# Patient Record
Sex: Female | Born: 1940 | Race: White | Hispanic: No | Smoking: Never smoker
Health system: Southern US, Community
[De-identification: ages and names within clinical notes are randomized; demographics above are authoritative.]

## PROBLEM LIST (undated history)

## (undated) DIAGNOSIS — I10 Essential (primary) hypertension: Secondary | ICD-10-CM

## (undated) DIAGNOSIS — Z9289 Personal history of other medical treatment: Secondary | ICD-10-CM

## (undated) DIAGNOSIS — D649 Anemia, unspecified: Secondary | ICD-10-CM

## (undated) HISTORY — PX: TONSILLECTOMY: SUR1361

## (undated) HISTORY — PX: DILATION AND CURETTAGE OF UTERUS: SHX78

## (undated) HISTORY — PX: TUBAL LIGATION: SHX77

---

## 1979-07-05 HISTORY — PX: CHOLECYSTECTOMY: SHX55

## 2007-02-17 ENCOUNTER — Emergency Department (HOSPITAL_COMMUNITY): Admission: EM | Admit: 2007-02-17 | Discharge: 2007-02-17 | Payer: Self-pay | Admitting: Emergency Medicine

## 2011-09-08 DIAGNOSIS — J019 Acute sinusitis, unspecified: Secondary | ICD-10-CM | POA: Diagnosis not present

## 2011-09-08 DIAGNOSIS — I10 Essential (primary) hypertension: Secondary | ICD-10-CM | POA: Diagnosis not present

## 2011-09-08 DIAGNOSIS — E782 Mixed hyperlipidemia: Secondary | ICD-10-CM | POA: Diagnosis not present

## 2011-09-08 DIAGNOSIS — M76899 Other specified enthesopathies of unspecified lower limb, excluding foot: Secondary | ICD-10-CM | POA: Diagnosis not present

## 2011-09-19 DIAGNOSIS — Z1231 Encounter for screening mammogram for malignant neoplasm of breast: Secondary | ICD-10-CM | POA: Diagnosis not present

## 2011-12-05 DIAGNOSIS — H2510 Age-related nuclear cataract, unspecified eye: Secondary | ICD-10-CM | POA: Diagnosis not present

## 2011-12-09 DIAGNOSIS — I10 Essential (primary) hypertension: Secondary | ICD-10-CM | POA: Diagnosis not present

## 2012-01-16 ENCOUNTER — Encounter (HOSPITAL_COMMUNITY): Payer: Self-pay | Admitting: Pharmacy Technician

## 2012-01-16 DIAGNOSIS — H2510 Age-related nuclear cataract, unspecified eye: Secondary | ICD-10-CM | POA: Diagnosis not present

## 2012-01-16 DIAGNOSIS — H547 Unspecified visual loss: Secondary | ICD-10-CM | POA: Diagnosis not present

## 2012-01-19 ENCOUNTER — Encounter (HOSPITAL_COMMUNITY): Payer: Self-pay

## 2012-01-19 ENCOUNTER — Encounter (HOSPITAL_COMMUNITY)
Admission: RE | Admit: 2012-01-19 | Discharge: 2012-01-19 | Disposition: A | Discharge status: Home / Self Care | Payer: Medicare Other | Source: Ambulatory Visit | Attending: Ophthalmology | Admitting: Ophthalmology

## 2012-01-19 DIAGNOSIS — I10 Essential (primary) hypertension: Secondary | ICD-10-CM | POA: Diagnosis not present

## 2012-01-19 DIAGNOSIS — H2510 Age-related nuclear cataract, unspecified eye: Secondary | ICD-10-CM | POA: Diagnosis not present

## 2012-01-19 DIAGNOSIS — Z79899 Other long term (current) drug therapy: Secondary | ICD-10-CM | POA: Diagnosis not present

## 2012-01-19 DIAGNOSIS — Z01812 Encounter for preprocedural laboratory examination: Secondary | ICD-10-CM | POA: Diagnosis not present

## 2012-01-19 DIAGNOSIS — Z01810 Encounter for preprocedural cardiovascular examination: Secondary | ICD-10-CM | POA: Diagnosis not present

## 2012-01-19 HISTORY — DX: Essential (primary) hypertension: I10

## 2012-01-19 HISTORY — DX: Anemia, unspecified: D64.9

## 2012-01-19 HISTORY — DX: Personal history of other medical treatment: Z92.89

## 2012-01-19 LAB — BASIC METABOLIC PANEL
BUN: 12 mg/dL (ref 6–23)
CO2: 30 mEq/L (ref 19–32)
Chloride: 104 mEq/L (ref 96–112)
Glucose, Bld: 100 mg/dL — ABNORMAL HIGH (ref 70–99)
Potassium: 3.7 mEq/L (ref 3.5–5.1)
Sodium: 142 mEq/L (ref 135–145)

## 2012-01-19 LAB — HEMOGLOBIN AND HEMATOCRIT, BLOOD: HCT: 40.1 % (ref 36.0–46.0)

## 2012-01-19 NOTE — Patient Instructions (Addendum)
Your procedure is scheduled on:  Monday, 01/23/12  Report to [facility] at  1230 PM.  Call this number if you have problems the morning of surgery: [phone]   Remember:   Do not eat or drink   After Midnight.  Take these medicines the morning of surgery with A SIP OF WATER: Cardura and Zestoretic   Do not wear jewelry, make-up or nail polish.  Do not wear lotions, powders, or perfumes. You may wear deodorant.  Do not bring valuables to the hospital.  Contacts, dentures or bridgework may not be worn into surgery.     Patients discharged the day of surgery will not be allowed to drive home.  Name and phone number of your driver: driver  Special Instructions: Use eye drops as directed.   Please read over the following fact sheets that you were given: Pain Booklet, Anesthesia Post-op Instructions and Care and Recovery After Surgery    Cataract Surgery  A cataract is a clouding of the lens of the eye. When a lens becomes cloudy, vision is reduced based on the degree and nature of the clouding. Surgery may be needed to improve vision. Surgery removes the cloudy lens and usually replaces it with a substitute lens (intraocular lens, IOL). LET YOUR EYE DOCTOR KNOW ABOUT:  Allergies to food or medicine.   Medicines taken including herbs, eyedrops, over-the-counter medicines, and creams.   Use of steroids (by mouth or creams).   Previous problems with anesthetics or numbing medicine.   History of bleeding problems or blood clots.   Previous surgery.   Other health problems, including diabetes and kidney problems.   Possibility of pregnancy, if this applies.  RISKS AND COMPLICATIONS  Infection.   Inflammation of the eyeball (endophthalmitis) that can spread to both eyes (sympathetic ophthalmia).   Poor wound healing.   If an IOL is inserted, it can later fall out of proper position. This is very uncommon.   Clouding of the part of your eye that holds an IOL in place. This is  called an "after-cataract." These are uncommon, but easily treated.  BEFORE THE PROCEDURE  Do not eat or drink anything except small amounts of water for 8 to 12 before your surgery, or as directed by your caregiver.   Unless you are told otherwise, continue any eyedrops you have been prescribed.   Talk to your primary caregiver about all other medicines that you take (both prescription and non-prescription). In some cases, you may need to stop or change medicines near the time of your surgery. This is most important if you are taking blood-thinning medicine.Do not stop medicines unless you are told to do so.   Arrange for someone to drive you to and from the procedure.   Do not put contact lenses in either eye on the day of your surgery.  PROCEDURE There is more than one method for safely removing a cataract. Your doctor can explain the differences and help determine which is best for you. Phacoemulsification surgery is the most common form of cataract surgery.  An injection is given behind the eye or eyedrops are given to make this a painless procedure.   A small cut (incision) is made on the edge of the clear, dome-shaped surface that covers the front of the eye (cornea).   A tiny probe is painlessly inserted into the eye. This device gives off ultrasound waves that soften and break up the cloudy center of the lens. This makes it easier for the  cloudy lens to be removed by suction.   An IOL may be implanted.   The normal lens of the eye is covered by a clear capsule. Part of that capsule is intentionally left in the eye to support the IOL.   Your surgeon may or may not use stitches to close the incision.  There are other forms of cataract surgery that require a larger incision and stiches to close the eye. This approach is taken in cases where the doctor feels that the cataract cannot be easily removed using phacoemulsification. AFTER THE PROCEDURE  When an IOL is implanted, it  does not need care. It becomes a permanent part of your eye and cannot be seen or felt.   Your doctor will schedule follow-up exams to check on your progress.   Review your other medicines with your doctor to see which can be resumed after surgery.   Use eyedrops or take medicine as prescribed by your doctor.  Document Released: 06/09/2011 Document Reviewed: 06/06/2011 [facility] Patient Information 2012 [place], [place].  PATIENT INSTRUCTIONS POST-ANESTHESIA  IMMEDIATELY FOLLOWING SURGERY:  Do not drive or operate machinery for the first twenty four hours after surgery.  Do not make any important decisions for twenty four hours after surgery or while taking narcotic pain medications or sedatives.  If you develop intractable nausea and vomiting or a severe headache please notify your doctor immediately.  FOLLOW-UP:  Please make an appointment with your surgeon as instructed. You do not need to follow up with anesthesia unless specifically instructed to do so.  WOUND CARE INSTRUCTIONS (if applicable):  Keep a dry clean dressing on the anesthesia/puncture wound site if there is drainage.  Once the wound has quit draining you may leave it open to air.  Generally you should leave the bandage intact for twenty four hours unless there is drainage.  If the epidural site drains for more than 36-48 hours please call the anesthesia department.  QUESTIONS?:  Please feel free to call your physician or the hospital operator if you have any questions, and they will be happy to assist you.

## 2012-01-20 MED ORDER — PHENYLEPHRINE HCL 2.5 % OP SOLN
OPHTHALMIC | Status: AC
  Filled 2012-01-20: qty 2

## 2012-01-20 MED ORDER — TETRACAINE HCL 0.5 % OP SOLN
OPHTHALMIC | Status: AC
  Filled 2012-01-20: qty 2

## 2012-01-20 MED ORDER — CYCLOPENTOLATE-PHENYLEPHRINE 0.2-1 % OP SOLN
OPHTHALMIC | Status: AC
  Filled 2012-01-20: qty 2

## 2012-01-20 MED ORDER — NEOMYCIN-POLYMYXIN-DEXAMETH 3.5-10000-0.1 OP OINT
TOPICAL_OINTMENT | OPHTHALMIC | Status: AC
  Filled 2012-01-20: qty 3.5

## 2012-01-20 MED ORDER — LIDOCAINE HCL (PF) 1 % IJ SOLN
INTRAMUSCULAR | Status: AC
  Filled 2012-01-20: qty 2

## 2012-01-20 MED ORDER — LIDOCAINE HCL 3.5 % OP GEL
OPHTHALMIC | Status: AC
  Filled 2012-01-20: qty 5

## 2012-01-23 ENCOUNTER — Encounter (HOSPITAL_COMMUNITY)
Admission: RE | Disposition: A | Discharge status: Home / Self Care | Payer: Self-pay | Source: Ambulatory Visit | Attending: Ophthalmology

## 2012-01-23 ENCOUNTER — Encounter (HOSPITAL_COMMUNITY): Payer: Self-pay | Admitting: *Deleted

## 2012-01-23 ENCOUNTER — Ambulatory Visit (HOSPITAL_COMMUNITY)
Admission: RE | Admit: 2012-01-23 | Discharge: 2012-01-23 | Disposition: A | Discharge status: Home / Self Care | Payer: Medicare Other | Source: Ambulatory Visit | Attending: Ophthalmology | Admitting: Ophthalmology

## 2012-01-23 ENCOUNTER — Encounter (HOSPITAL_COMMUNITY): Payer: Self-pay | Admitting: Anesthesiology

## 2012-01-23 ENCOUNTER — Ambulatory Visit (HOSPITAL_COMMUNITY): Payer: Medicare Other | Admitting: Anesthesiology

## 2012-01-23 DIAGNOSIS — I10 Essential (primary) hypertension: Secondary | ICD-10-CM | POA: Diagnosis not present

## 2012-01-23 DIAGNOSIS — Z01810 Encounter for preprocedural cardiovascular examination: Secondary | ICD-10-CM | POA: Diagnosis not present

## 2012-01-23 DIAGNOSIS — Z01812 Encounter for preprocedural laboratory examination: Secondary | ICD-10-CM | POA: Insufficient documentation

## 2012-01-23 DIAGNOSIS — H269 Unspecified cataract: Secondary | ICD-10-CM | POA: Diagnosis not present

## 2012-01-23 DIAGNOSIS — Z79899 Other long term (current) drug therapy: Secondary | ICD-10-CM | POA: Insufficient documentation

## 2012-01-23 DIAGNOSIS — H2510 Age-related nuclear cataract, unspecified eye: Secondary | ICD-10-CM | POA: Diagnosis not present

## 2012-01-23 HISTORY — PX: CATARACT EXTRACTION W/PHACO: SHX586

## 2012-01-23 SURGERY — PHACOEMULSIFICATION, CATARACT, WITH IOL INSERTION
Anesthesia: Monitor Anesthesia Care | Site: Eye | Laterality: Right | Wound class: Clean

## 2012-01-23 MED ORDER — TETRACAINE HCL 0.5 % OP SOLN
1.0000 [drp] | OPHTHALMIC | Status: AC
  Administered 2012-01-23 (×3): 1 [drp] via OPHTHALMIC

## 2012-01-23 MED ORDER — BSS IO SOLN
INTRAOCULAR | Status: DC | PRN
  Administered 2012-01-23: 15 mL via INTRAOCULAR

## 2012-01-23 MED ORDER — POVIDONE-IODINE 5 % OP SOLN
OPHTHALMIC | Status: DC | PRN
  Administered 2012-01-23: 1 via OPHTHALMIC

## 2012-01-23 MED ORDER — EPINEPHRINE HCL 1 MG/ML IJ SOLN
INTRAOCULAR | Status: DC | PRN
  Administered 2012-01-23: 13:00:00

## 2012-01-23 MED ORDER — PHENYLEPHRINE HCL 2.5 % OP SOLN
1.0000 [drp] | OPHTHALMIC | Status: AC
  Administered 2012-01-23 (×3): 1 [drp] via OPHTHALMIC

## 2012-01-23 MED ORDER — LIDOCAINE HCL 3.5 % OP GEL
1.0000 "application " | Freq: Once | OPHTHALMIC | Status: AC
  Administered 2012-01-23: 1 via OPHTHALMIC

## 2012-01-23 MED ORDER — NEOMYCIN-POLYMYXIN-DEXAMETH 0.1 % OP OINT
TOPICAL_OINTMENT | OPHTHALMIC | Status: DC | PRN
  Administered 2012-01-23: 1 via OPHTHALMIC

## 2012-01-23 MED ORDER — KETOROLAC TROMETHAMINE 0.5 % OP SOLN: 1.0000 [drp] | OPHTHALMIC | Status: DC

## 2012-01-23 MED ORDER — MIDAZOLAM HCL 2 MG/2ML IJ SOLN
INTRAMUSCULAR | Status: AC
  Filled 2012-01-23: qty 2

## 2012-01-23 MED ORDER — LIDOCAINE 3.5 % OP GEL OPTIME - NO CHARGE
OPHTHALMIC | Status: DC | PRN
  Administered 2012-01-23: 2 [drp] via OPHTHALMIC

## 2012-01-23 MED ORDER — LIDOCAINE HCL (PF) 1 % IJ SOLN
INTRAMUSCULAR | Status: DC | PRN
  Administered 2012-01-23: .4 mL

## 2012-01-23 MED ORDER — MIDAZOLAM HCL 2 MG/2ML IJ SOLN
1.0000 mg | INTRAMUSCULAR | Status: DC | PRN
  Administered 2012-01-23: 2 mg via INTRAVENOUS

## 2012-01-23 MED ORDER — LACTATED RINGERS IV SOLN
INTRAVENOUS | Status: DC
  Administered 2012-01-23: 1000 mL via INTRAVENOUS

## 2012-01-23 MED ORDER — CYCLOPENTOLATE-PHENYLEPHRINE 0.2-1 % OP SOLN
1.0000 [drp] | OPHTHALMIC | Status: AC
  Administered 2012-01-23 (×3): 1 [drp] via OPHTHALMIC

## 2012-01-23 MED ORDER — PROVISC 10 MG/ML IO SOLN
INTRAOCULAR | Status: DC | PRN
  Administered 2012-01-23: 8.5 mg via INTRAOCULAR

## 2012-01-23 SURGICAL SUPPLY — 32 items

## 2012-01-23 NOTE — Transfer of Care (Signed)
Immediate Anesthesia Transfer of Care Note  Patient: Janice [name]  Procedure(s) Performed: Procedure(s) (LRB): CATARACT EXTRACTION PHACO AND INTRAOCULAR LENS PLACEMENT (IOC) (Right)  Patient Location: Shortstay  Anesthesia Type: MAC  Level of Consciousness: awake  Airway & Oxygen Therapy: Patient Spontanous Breathing   Post-op Assessment: Report given to PACU RN, Post -op Vital signs reviewed and stable and Patient moving all extremities  Post vital signs: Reviewed and stable  Complications: No apparent anesthesia complications

## 2012-01-23 NOTE — H&P (Signed)
I have reviewed the H&P, the patient was re-examined, and I have identified no interval changes in medical condition and plan of care since the history and physical of record  

## 2012-01-23 NOTE — Brief Op Note (Signed)
Pre-Op Dx: Cataract OD Post-Op Dx: Cataract OD Surgeon: Jamyah Folk Anesthesia: Topical with MAC Surgery: Cataract Extraction with Intraocular lens Implant OD Implant: Lenstec, Model Softec HD Blood Loss: None Specimen: None Complications: None 

## 2012-01-23 NOTE — Anesthesia Procedure Notes (Signed)
Procedure Name: MAC Date/Time: 01/23/2012 1:18 PM Performed by: Franco Nones Pre-anesthesia Checklist: Patient identified, Emergency Drugs available, Suction available, Timeout performed and Patient being monitored Patient Re-evaluated:Patient Re-evaluated prior to inductionOxygen Delivery Method: Nasal Cannula

## 2012-01-23 NOTE — Anesthesia Preprocedure Evaluation (Addendum)
Anesthesia Evaluation  Patient identified by MRN, date of birth, ID band Patient awake    Reviewed: Allergy & Precautions, H&P , NPO status , Patient's Chart, lab work & pertinent test results  History of Anesthesia Complications Negative for: history of anesthetic complications  Airway Mallampati: II      Dental  (+) Teeth Intact   Pulmonary neg pulmonary ROS,  breath sounds clear to auscultation        Cardiovascular hypertension, Pt. on medications Rhythm:Regular     Neuro/Psych    GI/Hepatic negative GI ROS,   Endo/Other    Renal/GU      Musculoskeletal   Abdominal   Peds  Hematology   Anesthesia Other Findings   Reproductive/Obstetrics                          Anesthesia Physical Anesthesia Plan  ASA: II  Anesthesia Plan: MAC   Post-op Pain Management:    Induction: Intravenous  Airway Management Planned: Nasal Cannula  Additional Equipment:   Intra-op Plan:   Post-operative Plan:   Informed Consent: I have reviewed the patients History and Physical, chart, labs and discussed the procedure including the risks, benefits and alternatives for the proposed anesthesia with the patient or authorized representative who has indicated his/her understanding and acceptance.     Plan Discussed with:   Anesthesia Plan Comments:         Anesthesia Quick Evaluation

## 2012-01-23 NOTE — Anesthesia Postprocedure Evaluation (Signed)
  Anesthesia Post-op Note  Patient: Janice [name]  Procedure(s) Performed: Procedure(s) (LRB): CATARACT EXTRACTION PHACO AND INTRAOCULAR LENS PLACEMENT (IOC) (Right)  Patient Location:  Short Stay  Anesthesia Type: MAC  Level of Consciousness: awake  Airway and Oxygen Therapy: Patient Spontanous Breathing  Post-op Pain: none  Post-op Assessment: Post-op Vital signs reviewed, Patient's Cardiovascular Status Stable, Respiratory Function Stable, Patent Airway, No signs of Nausea or vomiting and Pain level controlled  Post-op Vital Signs: Reviewed and stable  Complications: No apparent anesthesia complications

## 2012-01-24 NOTE — Op Note (Signed)
NAME:  Janice [name], Janice [name] NO.:  [id number]  MEDICAL RECORD NO.:  [id number]  LOCATION:  [facility]                          FACILITY:  [facility]  PHYSICIAN:  Susanne Greenhouse, MD       DATE OF BIRTH:  1941/02/25  DATE OF PROCEDURE:  01/23/2012 DATE OF DISCHARGE:  01/23/2012                              OPERATIVE REPORT   PREOPERATIVE DIAGNOSIS:  Nuclear cataract, right eye.  POSTOPERATIVE DIAGNOSIS:  Nuclear cataract, right eye,  DIAGNOSIS CODE:  366.16.  OPERATION PERFORMED:  Phacoemulsification with posterior chamber intraocular lens implantation, right eye.  SURGEON:  Susanne Greenhouse, MD  ANESTHESIA:  Topical with monitored anesthesia care and IV sedation.  OPERATIVE SUMMARY:  In the preoperative area, dilating drops were placed into the right eye.  The patient was then brought into the operating room where she was placed under general anesthesia.  The eye was then prepped and draped.  Beginning with a #75 blade, a paracentesis port was made at the surgeon's 2 o'clock position.  The anterior chamber was then filled with a 1% nonpreserved lidocaine solution with epinephrine.  This was followed by Viscoat to deepen the chamber.  A small fornix-based peritomy was performed superiorly.  Next, a single iris hook was placed through the limbus superiorly.  A 2.4-mm keratome blade was then used to make a clear corneal incision over the iris hook.  A bent cystotome needle and Utrata forceps were used to create a continuous tear capsulotomy.  Hydrodissection was performed using balanced salt solution on a fine cannula.  The lens nucleus was then removed using phacoemulsification in a quadrant cracking technique.  The cortical material was then removed with irrigation and aspiration.  The capsular bag and anterior chamber were refilled with Provisc.  The wound was widened to approximately 3 mm and a posterior chamber intraocular lens was placed into the capsular bag without difficulty  using an [employer] lens injecting system.  A single 10-0 nylon suture was then used to close the incision as well as stromal hydration.  The Provisc was removed from the anterior chamber and capsular bag with irrigation and aspiration.  At this point, the wounds were tested for leak, which were negative.  The anterior chamber remained deep and stable.  The patient tolerated the procedure well.  There were no operative complications, and she awoke from general anesthesia without problem.  SPECIMENS: No surgical specimens.  PROSTHETIC DEVICE USED: A Lenstec posterior chamber lens, model Softec HD, power of 23.25, serial number is [id number].          ______________________________ Susanne Greenhouse, MD     KEH/MEDQ  D:  01/23/2012  T:  01/24/2012  Job:  [id number]

## 2012-01-26 ENCOUNTER — Encounter (HOSPITAL_COMMUNITY): Payer: Self-pay | Admitting: Ophthalmology

## 2012-01-30 DIAGNOSIS — H2510 Age-related nuclear cataract, unspecified eye: Secondary | ICD-10-CM | POA: Diagnosis not present

## 2012-02-07 ENCOUNTER — Encounter (HOSPITAL_COMMUNITY)
Admission: RE | Admit: 2012-02-07 | Discharge: 2012-02-07 | Payer: Medicare Other | Source: Ambulatory Visit | Admitting: Ophthalmology

## 2012-02-07 ENCOUNTER — Encounter (HOSPITAL_COMMUNITY): Payer: Self-pay

## 2012-02-07 MED ORDER — FENTANYL CITRATE 0.05 MG/ML IJ SOLN: 25.0000 ug | INTRAMUSCULAR | Status: DC | PRN

## 2012-02-07 MED ORDER — ONDANSETRON HCL 4 MG/2ML IJ SOLN: 4.0000 mg | Freq: Once | INTRAMUSCULAR | Status: DC | PRN

## 2012-02-10 MED ORDER — LIDOCAINE HCL 3.5 % OP GEL
OPHTHALMIC | Status: AC
  Filled 2012-02-10: qty 5

## 2012-02-10 MED ORDER — NEOMYCIN-POLYMYXIN-DEXAMETH 3.5-10000-0.1 OP OINT
TOPICAL_OINTMENT | OPHTHALMIC | Status: AC
  Filled 2012-02-10: qty 3.5

## 2012-02-10 MED ORDER — TETRACAINE HCL 0.5 % OP SOLN
OPHTHALMIC | Status: AC
  Filled 2012-02-10: qty 2

## 2012-02-10 MED ORDER — PHENYLEPHRINE HCL 2.5 % OP SOLN
OPHTHALMIC | Status: AC
  Filled 2012-02-10: qty 2

## 2012-02-10 MED ORDER — CYCLOPENTOLATE HCL 1 % OP SOLN
OPHTHALMIC | Status: AC
  Filled 2012-02-10: qty 2

## 2012-02-10 MED ORDER — LIDOCAINE HCL (PF) 1 % IJ SOLN
INTRAMUSCULAR | Status: AC
  Filled 2012-02-10: qty 2

## 2012-02-13 ENCOUNTER — Ambulatory Visit (HOSPITAL_COMMUNITY)
Admission: RE | Admit: 2012-02-13 | Discharge: 2012-02-13 | Disposition: A | Discharge status: Home / Self Care | Payer: Medicare Other | Source: Ambulatory Visit | Attending: Ophthalmology | Admitting: Ophthalmology

## 2012-02-13 ENCOUNTER — Ambulatory Visit (HOSPITAL_COMMUNITY): Payer: Medicare Other | Admitting: Anesthesiology

## 2012-02-13 ENCOUNTER — Encounter (HOSPITAL_COMMUNITY)
Admission: RE | Disposition: A | Discharge status: Home / Self Care | Payer: Self-pay | Source: Ambulatory Visit | Attending: Ophthalmology

## 2012-02-13 ENCOUNTER — Encounter (HOSPITAL_COMMUNITY): Payer: Self-pay | Admitting: *Deleted

## 2012-02-13 ENCOUNTER — Encounter (HOSPITAL_COMMUNITY): Payer: Self-pay | Admitting: Anesthesiology

## 2012-02-13 DIAGNOSIS — Z79899 Other long term (current) drug therapy: Secondary | ICD-10-CM | POA: Diagnosis not present

## 2012-02-13 DIAGNOSIS — H2510 Age-related nuclear cataract, unspecified eye: Secondary | ICD-10-CM | POA: Diagnosis not present

## 2012-02-13 DIAGNOSIS — I10 Essential (primary) hypertension: Secondary | ICD-10-CM | POA: Diagnosis not present

## 2012-02-13 DIAGNOSIS — H269 Unspecified cataract: Secondary | ICD-10-CM | POA: Diagnosis not present

## 2012-02-13 HISTORY — PX: CATARACT EXTRACTION W/PHACO: SHX586

## 2012-02-13 SURGERY — PHACOEMULSIFICATION, CATARACT, WITH IOL INSERTION
Anesthesia: Monitor Anesthesia Care | Site: Eye | Laterality: Left | Wound class: Clean

## 2012-02-13 MED ORDER — PHENYLEPHRINE HCL 2.5 % OP SOLN
1.0000 [drp] | OPHTHALMIC | Status: AC
  Administered 2012-02-13 (×3): 1 [drp] via OPHTHALMIC

## 2012-02-13 MED ORDER — BSS IO SOLN
INTRAOCULAR | Status: DC | PRN
  Administered 2012-02-13: 15 mL via INTRAOCULAR

## 2012-02-13 MED ORDER — LACTATED RINGERS IV SOLN
INTRAVENOUS | Status: DC | PRN
  Administered 2012-02-13: 08:00:00 via INTRAVENOUS

## 2012-02-13 MED ORDER — LIDOCAINE HCL 3.5 % OP GEL
1.0000 "application " | Freq: Once | OPHTHALMIC | Status: AC
  Administered 2012-02-13: 1 via OPHTHALMIC

## 2012-02-13 MED ORDER — MIDAZOLAM HCL 2 MG/2ML IJ SOLN
INTRAMUSCULAR | Status: AC
  Filled 2012-02-13: qty 2

## 2012-02-13 MED ORDER — LIDOCAINE HCL (PF) 1 % IJ SOLN
INTRAMUSCULAR | Status: DC | PRN
  Administered 2012-02-13: .4 mL

## 2012-02-13 MED ORDER — CYCLOPENTOLATE HCL 1 % OP SOLN
1.0000 [drp] | OPHTHALMIC | Status: AC
  Administered 2012-02-13 (×3): 1 [drp] via OPHTHALMIC

## 2012-02-13 MED ORDER — LIDOCAINE 3.5 % OP GEL OPTIME - NO CHARGE
OPHTHALMIC | Status: DC | PRN
  Administered 2012-02-13: 1 [drp] via OPHTHALMIC

## 2012-02-13 MED ORDER — NEOMYCIN-POLYMYXIN-DEXAMETH 0.1 % OP OINT
TOPICAL_OINTMENT | OPHTHALMIC | Status: DC | PRN
  Administered 2012-02-13: 1 via OPHTHALMIC

## 2012-02-13 MED ORDER — EPINEPHRINE HCL 1 MG/ML IJ SOLN
INTRAOCULAR | Status: DC | PRN
  Administered 2012-02-13: 08:00:00

## 2012-02-13 MED ORDER — PROVISC 10 MG/ML IO SOLN
INTRAOCULAR | Status: DC | PRN
  Administered 2012-02-13: 8.5 mg via INTRAOCULAR

## 2012-02-13 MED ORDER — MIDAZOLAM HCL 2 MG/2ML IJ SOLN
1.0000 mg | INTRAMUSCULAR | Status: DC | PRN
  Administered 2012-02-13: 2 mg via INTRAVENOUS

## 2012-02-13 MED ORDER — LACTATED RINGERS IV SOLN
INTRAVENOUS | Status: DC
  Administered 2012-02-13: 1000 mL via INTRAVENOUS

## 2012-02-13 MED ORDER — POVIDONE-IODINE 5 % OP SOLN
OPHTHALMIC | Status: DC | PRN
  Administered 2012-02-13: 1 via OPHTHALMIC

## 2012-02-13 MED ORDER — TETRACAINE HCL 0.5 % OP SOLN
1.0000 [drp] | OPHTHALMIC | Status: AC
  Administered 2012-02-13 (×3): 1 [drp] via OPHTHALMIC

## 2012-02-13 SURGICAL SUPPLY — 32 items

## 2012-02-13 NOTE — Transfer of Care (Signed)
Immediate Anesthesia Transfer of Care Note  Patient: Janice [name]  Procedure(s) Performed: Procedure(s) (LRB): CATARACT EXTRACTION PHACO AND INTRAOCULAR LENS PLACEMENT (IOC) (Left)  Patient Location: Short Stay  Anesthesia Type: MAC  Level of Consciousness: awake and alert   Airway & Oxygen Therapy: Patient Spontanous Breathing  Post-op Assessment: Report given to PACU RN  Post vital signs: Reviewed and stable  Complications: No apparent anesthesia complications

## 2012-02-13 NOTE — Anesthesia Postprocedure Evaluation (Signed)
  Anesthesia Post-op Note  Patient: Janice [name]  Procedure(s) Performed: Procedure(s) (LRB): CATARACT EXTRACTION PHACO AND INTRAOCULAR LENS PLACEMENT (IOC) (Left)  Patient Location: Short Stay  Anesthesia Type: MAC  Level of Consciousness: awake, alert  and oriented  Airway and Oxygen Therapy: Patient Spontanous Breathing  Post-op Pain: none  Post-op Assessment: Post-op Vital signs reviewed, Patient's Cardiovascular Status Stable, Respiratory Function Stable, Patent Airway, No signs of Nausea or vomiting and Pain level controlled  Post-op Vital Signs: Reviewed and stable  Complications: No apparent anesthesia complications

## 2012-02-13 NOTE — H&P (Signed)
I have reviewed the H&P, the patient was re-examined, and I have identified no interval changes in medical condition and plan of care since the history and physical of record  

## 2012-02-13 NOTE — Brief Op Note (Signed)
Pre-Op Dx: Cataract OS Post-Op Dx: Cataract OS Surgeon: Lynelle Weiler Anesthesia: Topical with MAC Surgery: Cataract Extraction with Intraocular lens Implant OS Implant: Lenstec, Model Softec HD Specimen: None Complications: None 

## 2012-02-13 NOTE — Anesthesia Preprocedure Evaluation (Signed)
Anesthesia Evaluation  Patient identified by MRN, date of birth, ID band Patient awake    Reviewed: Allergy & Precautions, H&P , NPO status , Patient's Chart, lab work & pertinent test results  History of Anesthesia Complications Negative for: history of anesthetic complications  Airway Mallampati: II      Dental  (+) Teeth Intact   Pulmonary neg pulmonary ROS,  breath sounds clear to auscultation        Cardiovascular hypertension, Pt. on medications Rhythm:Regular     Neuro/Psych    GI/Hepatic negative GI ROS,   Endo/Other    Renal/GU      Musculoskeletal   Abdominal   Peds  Hematology   Anesthesia Other Findings   Reproductive/Obstetrics                          Anesthesia Physical Anesthesia Plan  ASA: II  Anesthesia Plan: MAC   Post-op Pain Management:    Induction: Intravenous  Airway Management Planned: Nasal Cannula  Additional Equipment:   Intra-op Plan:   Post-operative Plan:   Informed Consent: I have reviewed the patients History and Physical, chart, labs and discussed the procedure including the risks, benefits and alternatives for the proposed anesthesia with the patient or authorized representative who has indicated his/her understanding and acceptance.     Plan Discussed with:   Anesthesia Plan Comments:         Anesthesia Quick Evaluation  

## 2012-02-13 NOTE — Preoperative (Signed)
Beta Blockers   Reason not to administer Beta Blockers:Not Applicable 

## 2012-02-14 NOTE — Op Note (Signed)
NAME:  Janice [name], Janice [name] NO.:  [id number]  MEDICAL RECORD NO.:  [id number]  LOCATION:  [facility]                          FACILITY:  [facility]  PHYSICIAN:  Susanne Greenhouse, MD       DATE OF BIRTH:  25-Feb-1941  DATE OF PROCEDURE:  02/13/2012 DATE OF DISCHARGE:  02/13/2012                              OPERATIVE REPORT   PREOPERATIVE DIAGNOSIS:  Nuclear cataract, left eye, diagnosis code 366.16.  POSTOPERATIVE DIAGNOSIS:  Nuclear cataract, left eye, diagnosis code 366.16.  OPERATION PERFORMED:  Phacoemulsification with posterior chamber intraocular lens implantation, left eye.  SURGEON:  Susanne Greenhouse, MD  ANESTHESIA:  Topical with monitored anesthesia care and IV sedation.  OPERATIVE SUMMARY:  In the preoperative area, dilating drops were placed into the left eye.  The patient was then brought into the operating room where she was placed under general anesthesia.  The eye was then prepped and draped.  Beginning with a 75 blade, a paracentesis port was made at the surgeon's 2 o'clock position.  The anterior chamber was then filled with a 1% nonpreserved lidocaine solution with epinephrine.  This was followed by Viscoat to deepen the chamber.  A small fornix-based peritomy was performed superiorly.  Next, a single iris hook was placed through the limbus superiorly.  A 2.4-mm keratome blade was then used to make a clear corneal incision over the iris hook.  A bent cystotome needle and Utrata forceps were used to create a continuous tear capsulotomy.  Hydrodissection was performed using balanced salt solution on a fine cannula.  The lens nucleus was then removed using phacoemulsification in a quadrant cracking technique.  The cortical material was then removed with irrigation and aspiration.  The capsular bag and anterior chamber were refilled with Provisc.  The wound was widened to approximately 3 mm and a posterior chamber intraocular lens was placed into the capsular bag  without difficulty using an [employer] lens injecting system.  A single 10-0 nylon suture was then used to close the incision as well as stromal hydration.  The Provisc was removed from the anterior chamber and capsular bag with irrigation and aspiration.  At this point, the wounds were tested for leak, which were negative.  The anterior chamber remained deep and stable.  The patient tolerated the procedure well.  There were no operative complications, and she awoke from general anesthesia without problem.  There were no surgical specimens.  Prosthetic device used is a Lenstec posterior chamber lens, model Softec HD, power of 22.75, serial number is [id number].          ______________________________ Susanne Greenhouse, MD     KEH/MEDQ  D:  02/13/2012  T:  02/14/2012  Job:  [id number]

## 2012-02-16 ENCOUNTER — Encounter (HOSPITAL_COMMUNITY): Payer: Self-pay | Admitting: Ophthalmology

## 2012-03-09 DIAGNOSIS — I10 Essential (primary) hypertension: Secondary | ICD-10-CM | POA: Diagnosis not present

## 2012-06-11 DIAGNOSIS — I10 Essential (primary) hypertension: Secondary | ICD-10-CM | POA: Diagnosis not present

## 2012-09-10 DIAGNOSIS — I10 Essential (primary) hypertension: Secondary | ICD-10-CM | POA: Diagnosis not present

## 2012-09-10 DIAGNOSIS — E782 Mixed hyperlipidemia: Secondary | ICD-10-CM | POA: Diagnosis not present

## 2012-09-19 DIAGNOSIS — Z1231 Encounter for screening mammogram for malignant neoplasm of breast: Secondary | ICD-10-CM | POA: Diagnosis not present

## 2013-01-11 DIAGNOSIS — I10 Essential (primary) hypertension: Secondary | ICD-10-CM | POA: Diagnosis not present

## 2013-01-11 DIAGNOSIS — L570 Actinic keratosis: Secondary | ICD-10-CM | POA: Diagnosis not present

## 2013-04-15 DIAGNOSIS — I10 Essential (primary) hypertension: Secondary | ICD-10-CM | POA: Diagnosis not present

## 2013-05-03 DIAGNOSIS — Z23 Encounter for immunization: Secondary | ICD-10-CM | POA: Diagnosis not present

## 2013-07-16 DIAGNOSIS — I10 Essential (primary) hypertension: Secondary | ICD-10-CM | POA: Diagnosis not present

## 2013-07-16 DIAGNOSIS — Z0000 Encounter for general adult medical examination without abnormal findings: Secondary | ICD-10-CM | POA: Diagnosis not present

## 2013-07-17 DIAGNOSIS — I10 Essential (primary) hypertension: Secondary | ICD-10-CM | POA: Diagnosis not present

## 2013-07-17 DIAGNOSIS — Z131 Encounter for screening for diabetes mellitus: Secondary | ICD-10-CM | POA: Diagnosis not present

## 2013-09-26 DIAGNOSIS — Z1231 Encounter for screening mammogram for malignant neoplasm of breast: Secondary | ICD-10-CM | POA: Diagnosis not present

## 2013-10-14 DIAGNOSIS — I10 Essential (primary) hypertension: Secondary | ICD-10-CM | POA: Diagnosis not present

## 2014-01-13 DIAGNOSIS — IMO0002 Reserved for concepts with insufficient information to code with codable children: Secondary | ICD-10-CM | POA: Diagnosis not present

## 2014-01-13 DIAGNOSIS — I10 Essential (primary) hypertension: Secondary | ICD-10-CM | POA: Diagnosis not present

## 2014-04-15 DIAGNOSIS — I10 Essential (primary) hypertension: Secondary | ICD-10-CM | POA: Diagnosis not present

## 2014-06-03 DIAGNOSIS — J018 Other acute sinusitis: Secondary | ICD-10-CM | POA: Diagnosis not present

## 2014-06-04 DIAGNOSIS — I10 Essential (primary) hypertension: Secondary | ICD-10-CM | POA: Diagnosis not present

## 2014-10-21 DIAGNOSIS — Z1389 Encounter for screening for other disorder: Secondary | ICD-10-CM | POA: Diagnosis not present

## 2014-10-21 DIAGNOSIS — E785 Hyperlipidemia, unspecified: Secondary | ICD-10-CM | POA: Diagnosis not present

## 2014-10-21 DIAGNOSIS — Z0000 Encounter for general adult medical examination without abnormal findings: Secondary | ICD-10-CM | POA: Diagnosis not present

## 2014-10-21 DIAGNOSIS — I10 Essential (primary) hypertension: Secondary | ICD-10-CM | POA: Diagnosis not present

## 2014-10-23 DIAGNOSIS — E785 Hyperlipidemia, unspecified: Secondary | ICD-10-CM | POA: Diagnosis not present

## 2015-11-11 DIAGNOSIS — H1031 Unspecified acute conjunctivitis, right eye: Secondary | ICD-10-CM | POA: Diagnosis not present

## 2015-11-11 DIAGNOSIS — Z961 Presence of intraocular lens: Secondary | ICD-10-CM | POA: Diagnosis not present

## 2015-12-02 DIAGNOSIS — Z0000 Encounter for general adult medical examination without abnormal findings: Secondary | ICD-10-CM | POA: Diagnosis not present

## 2015-12-02 DIAGNOSIS — M545 Low back pain: Secondary | ICD-10-CM | POA: Diagnosis not present

## 2015-12-02 DIAGNOSIS — Z23 Encounter for immunization: Secondary | ICD-10-CM | POA: Diagnosis not present

## 2015-12-02 DIAGNOSIS — Z131 Encounter for screening for diabetes mellitus: Secondary | ICD-10-CM | POA: Diagnosis not present

## 2015-12-02 DIAGNOSIS — Z1389 Encounter for screening for other disorder: Secondary | ICD-10-CM | POA: Diagnosis not present

## 2015-12-02 DIAGNOSIS — I10 Essential (primary) hypertension: Secondary | ICD-10-CM | POA: Diagnosis not present

## 2015-12-02 DIAGNOSIS — R5383 Other fatigue: Secondary | ICD-10-CM | POA: Diagnosis not present

## 2015-12-16 DIAGNOSIS — H524 Presbyopia: Secondary | ICD-10-CM | POA: Diagnosis not present

## 2015-12-16 DIAGNOSIS — Z961 Presence of intraocular lens: Secondary | ICD-10-CM | POA: Diagnosis not present

## 2016-03-10 DIAGNOSIS — I10 Essential (primary) hypertension: Secondary | ICD-10-CM | POA: Diagnosis not present

## 2016-03-10 DIAGNOSIS — M545 Low back pain: Secondary | ICD-10-CM | POA: Diagnosis not present

## 2016-06-09 DIAGNOSIS — Z131 Encounter for screening for diabetes mellitus: Secondary | ICD-10-CM | POA: Diagnosis not present

## 2016-06-09 DIAGNOSIS — M545 Low back pain: Secondary | ICD-10-CM | POA: Diagnosis not present

## 2016-06-09 DIAGNOSIS — I10 Essential (primary) hypertension: Secondary | ICD-10-CM | POA: Diagnosis not present

## 2016-09-08 DIAGNOSIS — M545 Low back pain: Secondary | ICD-10-CM | POA: Diagnosis not present

## 2016-09-08 DIAGNOSIS — I10 Essential (primary) hypertension: Secondary | ICD-10-CM | POA: Diagnosis not present

## 2016-12-08 DIAGNOSIS — Z79899 Other long term (current) drug therapy: Secondary | ICD-10-CM | POA: Diagnosis not present

## 2016-12-08 DIAGNOSIS — M545 Low back pain: Secondary | ICD-10-CM | POA: Diagnosis not present

## 2016-12-08 DIAGNOSIS — I10 Essential (primary) hypertension: Secondary | ICD-10-CM | POA: Diagnosis not present

## 2017-08-14 DIAGNOSIS — H26493 Other secondary cataract, bilateral: Secondary | ICD-10-CM | POA: Diagnosis not present

## 2017-08-14 DIAGNOSIS — H524 Presbyopia: Secondary | ICD-10-CM | POA: Diagnosis not present

## 2017-08-14 DIAGNOSIS — Z961 Presence of intraocular lens: Secondary | ICD-10-CM | POA: Diagnosis not present

## 2017-09-18 DIAGNOSIS — G4709 Other insomnia: Secondary | ICD-10-CM | POA: Diagnosis not present

## 2017-09-18 DIAGNOSIS — E7849 Other hyperlipidemia: Secondary | ICD-10-CM | POA: Diagnosis not present

## 2017-09-18 DIAGNOSIS — I10 Essential (primary) hypertension: Secondary | ICD-10-CM | POA: Diagnosis not present

## 2017-09-18 DIAGNOSIS — M545 Low back pain: Secondary | ICD-10-CM | POA: Diagnosis not present

## 2017-10-16 DIAGNOSIS — H26491 Other secondary cataract, right eye: Secondary | ICD-10-CM | POA: Diagnosis not present

## 2017-10-16 DIAGNOSIS — H26493 Other secondary cataract, bilateral: Secondary | ICD-10-CM | POA: Diagnosis not present

## 2017-10-16 DIAGNOSIS — H26492 Other secondary cataract, left eye: Secondary | ICD-10-CM | POA: Diagnosis not present

## 2018-01-18 DIAGNOSIS — G4709 Other insomnia: Secondary | ICD-10-CM | POA: Diagnosis not present

## 2018-01-18 DIAGNOSIS — E7849 Other hyperlipidemia: Secondary | ICD-10-CM | POA: Diagnosis not present

## 2018-01-18 DIAGNOSIS — M545 Low back pain: Secondary | ICD-10-CM | POA: Diagnosis not present

## 2018-01-18 DIAGNOSIS — I10 Essential (primary) hypertension: Secondary | ICD-10-CM | POA: Diagnosis not present

## 2018-05-17 DIAGNOSIS — Z0000 Encounter for general adult medical examination without abnormal findings: Secondary | ICD-10-CM | POA: Diagnosis not present

## 2018-05-17 DIAGNOSIS — E7849 Other hyperlipidemia: Secondary | ICD-10-CM | POA: Diagnosis not present

## 2018-05-17 DIAGNOSIS — M545 Low back pain: Secondary | ICD-10-CM | POA: Diagnosis not present

## 2018-05-17 DIAGNOSIS — I10 Essential (primary) hypertension: Secondary | ICD-10-CM | POA: Diagnosis not present

## 2018-05-17 DIAGNOSIS — G4709 Other insomnia: Secondary | ICD-10-CM | POA: Diagnosis not present

## 2018-05-17 DIAGNOSIS — Z1389 Encounter for screening for other disorder: Secondary | ICD-10-CM | POA: Diagnosis not present

## 2018-05-17 DIAGNOSIS — N181 Chronic kidney disease, stage 1: Secondary | ICD-10-CM | POA: Diagnosis not present

## 2018-06-25 DIAGNOSIS — M85852 Other specified disorders of bone density and structure, left thigh: Secondary | ICD-10-CM | POA: Diagnosis not present

## 2018-06-25 DIAGNOSIS — M810 Age-related osteoporosis without current pathological fracture: Secondary | ICD-10-CM | POA: Diagnosis not present

## 2018-06-25 DIAGNOSIS — Z780 Asymptomatic menopausal state: Secondary | ICD-10-CM | POA: Diagnosis not present

## 2018-09-03 DIAGNOSIS — N181 Chronic kidney disease, stage 1: Secondary | ICD-10-CM | POA: Diagnosis not present

## 2018-09-03 DIAGNOSIS — I10 Essential (primary) hypertension: Secondary | ICD-10-CM | POA: Diagnosis not present

## 2018-09-03 DIAGNOSIS — M545 Low back pain: Secondary | ICD-10-CM | POA: Diagnosis not present

## 2018-09-03 DIAGNOSIS — E7849 Other hyperlipidemia: Secondary | ICD-10-CM | POA: Diagnosis not present

## 2019-01-03 DIAGNOSIS — M545 Low back pain: Secondary | ICD-10-CM | POA: Diagnosis not present

## 2019-01-03 DIAGNOSIS — I10 Essential (primary) hypertension: Secondary | ICD-10-CM | POA: Diagnosis not present

## 2019-01-03 DIAGNOSIS — N181 Chronic kidney disease, stage 1: Secondary | ICD-10-CM | POA: Diagnosis not present

## 2019-01-03 DIAGNOSIS — E7849 Other hyperlipidemia: Secondary | ICD-10-CM | POA: Diagnosis not present

## 2019-02-01 ENCOUNTER — Other Ambulatory Visit: Payer: Self-pay

## 2019-05-06 DIAGNOSIS — N181 Chronic kidney disease, stage 1: Secondary | ICD-10-CM | POA: Diagnosis not present

## 2019-05-06 DIAGNOSIS — E7849 Other hyperlipidemia: Secondary | ICD-10-CM | POA: Diagnosis not present

## 2019-05-06 DIAGNOSIS — M545 Low back pain: Secondary | ICD-10-CM | POA: Diagnosis not present

## 2019-05-06 DIAGNOSIS — I10 Essential (primary) hypertension: Secondary | ICD-10-CM | POA: Diagnosis not present

## 2019-09-03 DIAGNOSIS — I10 Essential (primary) hypertension: Secondary | ICD-10-CM | POA: Diagnosis not present

## 2019-09-03 DIAGNOSIS — Z1389 Encounter for screening for other disorder: Secondary | ICD-10-CM | POA: Diagnosis not present

## 2019-09-03 DIAGNOSIS — E7849 Other hyperlipidemia: Secondary | ICD-10-CM | POA: Diagnosis not present

## 2019-09-03 DIAGNOSIS — N181 Chronic kidney disease, stage 1: Secondary | ICD-10-CM | POA: Diagnosis not present

## 2019-09-03 DIAGNOSIS — M545 Low back pain: Secondary | ICD-10-CM | POA: Diagnosis not present

## 2019-09-03 DIAGNOSIS — Z23 Encounter for immunization: Secondary | ICD-10-CM | POA: Diagnosis not present

## 2019-09-03 DIAGNOSIS — Z0000 Encounter for general adult medical examination without abnormal findings: Secondary | ICD-10-CM | POA: Diagnosis not present

## 2019-10-01 DIAGNOSIS — Z23 Encounter for immunization: Secondary | ICD-10-CM | POA: Diagnosis not present

## 2020-03-26 DIAGNOSIS — Z961 Presence of intraocular lens: Secondary | ICD-10-CM | POA: Diagnosis not present

## 2020-03-26 DIAGNOSIS — H524 Presbyopia: Secondary | ICD-10-CM | POA: Diagnosis not present

## 2020-04-17 DIAGNOSIS — Z23 Encounter for immunization: Secondary | ICD-10-CM | POA: Diagnosis not present

## 2020-05-11 DIAGNOSIS — I10 Essential (primary) hypertension: Secondary | ICD-10-CM | POA: Diagnosis not present

## 2020-05-11 DIAGNOSIS — E7849 Other hyperlipidemia: Secondary | ICD-10-CM | POA: Diagnosis not present

## 2020-05-11 DIAGNOSIS — M5459 Other low back pain: Secondary | ICD-10-CM | POA: Diagnosis not present

## 2020-05-11 DIAGNOSIS — N181 Chronic kidney disease, stage 1: Secondary | ICD-10-CM | POA: Diagnosis not present

## 2020-06-01 DIAGNOSIS — S134XXA Sprain of ligaments of cervical spine, initial encounter: Secondary | ICD-10-CM | POA: Diagnosis not present

## 2020-09-08 DIAGNOSIS — S134XXA Sprain of ligaments of cervical spine, initial encounter: Secondary | ICD-10-CM | POA: Diagnosis not present

## 2020-09-08 DIAGNOSIS — I10 Essential (primary) hypertension: Secondary | ICD-10-CM | POA: Diagnosis not present

## 2020-09-08 DIAGNOSIS — Z0000 Encounter for general adult medical examination without abnormal findings: Secondary | ICD-10-CM | POA: Diagnosis not present

## 2020-09-08 DIAGNOSIS — E7849 Other hyperlipidemia: Secondary | ICD-10-CM | POA: Diagnosis not present

## 2020-09-08 DIAGNOSIS — M5459 Other low back pain: Secondary | ICD-10-CM | POA: Diagnosis not present

## 2020-09-08 DIAGNOSIS — N181 Chronic kidney disease, stage 1: Secondary | ICD-10-CM | POA: Diagnosis not present

## 2020-09-21 DIAGNOSIS — M85852 Other specified disorders of bone density and structure, left thigh: Secondary | ICD-10-CM | POA: Diagnosis not present

## 2020-09-21 DIAGNOSIS — M810 Age-related osteoporosis without current pathological fracture: Secondary | ICD-10-CM | POA: Diagnosis not present

## 2020-09-21 DIAGNOSIS — Z780 Asymptomatic menopausal state: Secondary | ICD-10-CM | POA: Diagnosis not present

## 2021-01-12 DIAGNOSIS — E7849 Other hyperlipidemia: Secondary | ICD-10-CM | POA: Diagnosis not present

## 2021-01-12 DIAGNOSIS — M5459 Other low back pain: Secondary | ICD-10-CM | POA: Diagnosis not present

## 2021-01-12 DIAGNOSIS — I10 Essential (primary) hypertension: Secondary | ICD-10-CM | POA: Diagnosis not present

## 2021-01-12 DIAGNOSIS — N181 Chronic kidney disease, stage 1: Secondary | ICD-10-CM | POA: Diagnosis not present

## 2021-03-03 DIAGNOSIS — M5459 Other low back pain: Secondary | ICD-10-CM | POA: Diagnosis not present

## 2021-03-03 DIAGNOSIS — E7849 Other hyperlipidemia: Secondary | ICD-10-CM | POA: Diagnosis not present

## 2021-03-03 DIAGNOSIS — N181 Chronic kidney disease, stage 1: Secondary | ICD-10-CM | POA: Diagnosis not present

## 2021-03-03 DIAGNOSIS — I10 Essential (primary) hypertension: Secondary | ICD-10-CM | POA: Diagnosis not present

## 2021-03-05 DIAGNOSIS — U071 COVID-19: Secondary | ICD-10-CM | POA: Diagnosis not present

## 2021-04-02 DIAGNOSIS — E7849 Other hyperlipidemia: Secondary | ICD-10-CM | POA: Diagnosis not present

## 2021-04-02 DIAGNOSIS — I10 Essential (primary) hypertension: Secondary | ICD-10-CM | POA: Diagnosis not present

## 2021-04-05 DIAGNOSIS — I10 Essential (primary) hypertension: Secondary | ICD-10-CM | POA: Diagnosis not present

## 2021-04-19 DIAGNOSIS — I10 Essential (primary) hypertension: Secondary | ICD-10-CM | POA: Diagnosis not present

## 2021-05-03 DIAGNOSIS — E7849 Other hyperlipidemia: Secondary | ICD-10-CM | POA: Diagnosis not present

## 2021-05-03 DIAGNOSIS — I10 Essential (primary) hypertension: Secondary | ICD-10-CM | POA: Diagnosis not present

## 2021-06-02 DIAGNOSIS — I10 Essential (primary) hypertension: Secondary | ICD-10-CM | POA: Diagnosis not present

## 2021-06-02 DIAGNOSIS — E7849 Other hyperlipidemia: Secondary | ICD-10-CM | POA: Diagnosis not present

## 2021-07-02 DIAGNOSIS — I10 Essential (primary) hypertension: Secondary | ICD-10-CM | POA: Diagnosis not present

## 2021-07-02 DIAGNOSIS — E7849 Other hyperlipidemia: Secondary | ICD-10-CM | POA: Diagnosis not present

## 2021-07-08 DIAGNOSIS — I10 Essential (primary) hypertension: Secondary | ICD-10-CM | POA: Diagnosis not present

## 2021-10-07 DIAGNOSIS — Z1331 Encounter for screening for depression: Secondary | ICD-10-CM | POA: Diagnosis not present

## 2021-10-07 DIAGNOSIS — N181 Chronic kidney disease, stage 1: Secondary | ICD-10-CM | POA: Diagnosis not present

## 2021-10-07 DIAGNOSIS — Z0000 Encounter for general adult medical examination without abnormal findings: Secondary | ICD-10-CM | POA: Diagnosis not present

## 2021-10-07 DIAGNOSIS — E7849 Other hyperlipidemia: Secondary | ICD-10-CM | POA: Diagnosis not present

## 2021-10-07 DIAGNOSIS — I10 Essential (primary) hypertension: Secondary | ICD-10-CM | POA: Diagnosis not present

## 2021-10-07 DIAGNOSIS — R1010 Upper abdominal pain, unspecified: Secondary | ICD-10-CM | POA: Diagnosis not present

## 2021-10-15 DIAGNOSIS — M79605 Pain in left leg: Secondary | ICD-10-CM | POA: Diagnosis not present

## 2021-10-15 DIAGNOSIS — M7989 Other specified soft tissue disorders: Secondary | ICD-10-CM | POA: Diagnosis not present

## 2021-10-15 DIAGNOSIS — M79662 Pain in left lower leg: Secondary | ICD-10-CM | POA: Diagnosis not present

## 2021-11-08 ENCOUNTER — Emergency Department (HOSPITAL_COMMUNITY): Payer: Medicare Other

## 2021-11-08 ENCOUNTER — Inpatient Hospital Stay (HOSPITAL_COMMUNITY)
Admission: EM | Admit: 2021-11-08 | Discharge: 2021-11-12 | DRG: 065 | Disposition: A | Discharge status: Home / Self Care | Payer: Medicare Other | Attending: Family Medicine | Admitting: Family Medicine

## 2021-11-08 ENCOUNTER — Encounter (HOSPITAL_COMMUNITY): Payer: Self-pay | Admitting: Emergency Medicine

## 2021-11-08 ENCOUNTER — Other Ambulatory Visit: Payer: Self-pay

## 2021-11-08 DIAGNOSIS — N1831 Chronic kidney disease, stage 3a: Secondary | ICD-10-CM | POA: Diagnosis present

## 2021-11-08 DIAGNOSIS — R008 Other abnormalities of heart beat: Secondary | ICD-10-CM | POA: Diagnosis present

## 2021-11-08 DIAGNOSIS — I672 Cerebral atherosclerosis: Secondary | ICD-10-CM | POA: Diagnosis not present

## 2021-11-08 DIAGNOSIS — R410 Disorientation, unspecified: Secondary | ICD-10-CM | POA: Diagnosis present

## 2021-11-08 DIAGNOSIS — I6782 Cerebral ischemia: Secondary | ICD-10-CM | POA: Diagnosis not present

## 2021-11-08 DIAGNOSIS — I700 Atherosclerosis of aorta: Secondary | ICD-10-CM | POA: Diagnosis present

## 2021-11-08 DIAGNOSIS — R29810 Facial weakness: Secondary | ICD-10-CM | POA: Diagnosis present

## 2021-11-08 DIAGNOSIS — R471 Dysarthria and anarthria: Secondary | ICD-10-CM | POA: Diagnosis present

## 2021-11-08 DIAGNOSIS — R9431 Abnormal electrocardiogram [ECG] [EKG]: Secondary | ICD-10-CM | POA: Diagnosis not present

## 2021-11-08 DIAGNOSIS — I6602 Occlusion and stenosis of left middle cerebral artery: Secondary | ICD-10-CM | POA: Diagnosis not present

## 2021-11-08 DIAGNOSIS — I130 Hypertensive heart and chronic kidney disease with heart failure and stage 1 through stage 4 chronic kidney disease, or unspecified chronic kidney disease: Secondary | ICD-10-CM | POA: Diagnosis present

## 2021-11-08 DIAGNOSIS — D649 Anemia, unspecified: Secondary | ICD-10-CM | POA: Diagnosis present

## 2021-11-08 DIAGNOSIS — R29702 NIHSS score 2: Secondary | ICD-10-CM | POA: Diagnosis present

## 2021-11-08 DIAGNOSIS — I6350 Cerebral infarction due to unspecified occlusion or stenosis of unspecified cerebral artery: Secondary | ICD-10-CM | POA: Diagnosis not present

## 2021-11-08 DIAGNOSIS — Z888 Allergy status to other drugs, medicaments and biological substances status: Secondary | ICD-10-CM | POA: Diagnosis not present

## 2021-11-08 DIAGNOSIS — Z79899 Other long term (current) drug therapy: Secondary | ICD-10-CM | POA: Diagnosis not present

## 2021-11-08 DIAGNOSIS — E785 Hyperlipidemia, unspecified: Secondary | ICD-10-CM | POA: Diagnosis present

## 2021-11-08 DIAGNOSIS — R4701 Aphasia: Secondary | ICD-10-CM | POA: Diagnosis present

## 2021-11-08 DIAGNOSIS — Z23 Encounter for immunization: Secondary | ICD-10-CM | POA: Diagnosis not present

## 2021-11-08 DIAGNOSIS — R54 Age-related physical debility: Secondary | ICD-10-CM | POA: Diagnosis present

## 2021-11-08 DIAGNOSIS — R4182 Altered mental status, unspecified: Secondary | ICD-10-CM | POA: Diagnosis not present

## 2021-11-08 DIAGNOSIS — I639 Cerebral infarction, unspecified: Secondary | ICD-10-CM | POA: Diagnosis not present

## 2021-11-08 DIAGNOSIS — I6389 Other cerebral infarction: Secondary | ICD-10-CM | POA: Diagnosis not present

## 2021-11-08 DIAGNOSIS — I5032 Chronic diastolic (congestive) heart failure: Secondary | ICD-10-CM | POA: Diagnosis present

## 2021-11-08 DIAGNOSIS — I6523 Occlusion and stenosis of bilateral carotid arteries: Secondary | ICD-10-CM | POA: Diagnosis not present

## 2021-11-08 DIAGNOSIS — I63512 Cerebral infarction due to unspecified occlusion or stenosis of left middle cerebral artery: Principal | ICD-10-CM | POA: Diagnosis present

## 2021-11-08 DIAGNOSIS — Z6823 Body mass index (BMI) 23.0-23.9, adult: Secondary | ICD-10-CM

## 2021-11-08 DIAGNOSIS — I6623 Occlusion and stenosis of bilateral posterior cerebral arteries: Secondary | ICD-10-CM | POA: Diagnosis not present

## 2021-11-08 DIAGNOSIS — I63412 Cerebral infarction due to embolism of left middle cerebral artery: Secondary | ICD-10-CM | POA: Diagnosis not present

## 2021-11-08 DIAGNOSIS — G8311 Monoplegia of lower limb affecting right dominant side: Secondary | ICD-10-CM | POA: Diagnosis not present

## 2021-11-08 DIAGNOSIS — I619 Nontraumatic intracerebral hemorrhage, unspecified: Secondary | ICD-10-CM | POA: Diagnosis not present

## 2021-11-08 DIAGNOSIS — Z87891 Personal history of nicotine dependence: Secondary | ICD-10-CM | POA: Diagnosis not present

## 2021-11-08 DIAGNOSIS — G936 Cerebral edema: Secondary | ICD-10-CM | POA: Diagnosis not present

## 2021-11-08 LAB — CBC WITH DIFFERENTIAL/PLATELET
Abs Immature Granulocytes: 0.04 10*3/uL (ref 0.00–0.07)
Basophils Absolute: 0 10*3/uL (ref 0.0–0.1)
Basophils Relative: 1 %
Eosinophils Absolute: 0.1 10*3/uL (ref 0.0–0.5)
Eosinophils Relative: 2 %
HCT: 40.9 % (ref 36.0–46.0)
Hemoglobin: 13.8 g/dL (ref 12.0–15.0)
Immature Granulocytes: 1 %
Lymphocytes Relative: 33 %
Lymphs Abs: 2 10*3/uL (ref 0.7–4.0)
MCH: 29.9 pg (ref 26.0–34.0)
MCHC: 33.7 g/dL (ref 30.0–36.0)
MCV: 88.5 fL (ref 80.0–100.0)
Monocytes Absolute: 0.5 10*3/uL (ref 0.1–1.0)
Monocytes Relative: 8 %
Neutro Abs: 3.3 10*3/uL (ref 1.7–7.7)
Neutrophils Relative %: 55 %
Platelets: 192 10*3/uL (ref 150–400)
RBC: 4.62 MIL/uL (ref 3.87–5.11)
RDW: 13.3 % (ref 11.5–15.5)
WBC: 6 10*3/uL (ref 4.0–10.5)
nRBC: 0 % (ref 0.0–0.2)

## 2021-11-08 LAB — COMPREHENSIVE METABOLIC PANEL
ALT: 16 U/L (ref 0–44)
AST: 18 U/L (ref 15–41)
Albumin: 3.8 g/dL (ref 3.5–5.0)
Alkaline Phosphatase: 53 U/L (ref 38–126)
Anion gap: 8 (ref 5–15)
BUN: 13 mg/dL (ref 8–23)
CO2: 25 mmol/L (ref 22–32)
Calcium: 9.5 mg/dL (ref 8.9–10.3)
Chloride: 108 mmol/L (ref 98–111)
Creatinine, Ser: 1.11 mg/dL — ABNORMAL HIGH (ref 0.44–1.00)
GFR, Estimated: 50 mL/min — ABNORMAL LOW (ref >60)
Glucose, Bld: 107 mg/dL — ABNORMAL HIGH (ref 70–99)
Potassium: 3.6 mmol/L (ref 3.5–5.1)
Sodium: 141 mmol/L (ref 135–145)
Total Bilirubin: 0.7 mg/dL (ref 0.3–1.2)
Total Protein: 6.3 g/dL — ABNORMAL LOW (ref 6.5–8.1)

## 2021-11-08 LAB — URINALYSIS, ROUTINE W REFLEX MICROSCOPIC
Bilirubin Urine: NEGATIVE
Glucose, UA: NEGATIVE mg/dL
Hgb urine dipstick: NEGATIVE
Ketones, ur: NEGATIVE mg/dL
Leukocytes,Ua: NEGATIVE
Nitrite: NEGATIVE
Protein, ur: NEGATIVE mg/dL
Specific Gravity, Urine: 1.009 (ref 1.005–1.030)
pH: 7 (ref 5.0–8.0)

## 2021-11-08 LAB — AMMONIA: Ammonia: 34 umol/L (ref 9–35)

## 2021-11-08 LAB — TROPONIN I (HIGH SENSITIVITY): Troponin I (High Sensitivity): 8 ng/L (ref <18)

## 2021-11-08 LAB — ETHANOL: Alcohol, Ethyl (B): <10 mg/dL (ref <10)

## 2021-11-08 LAB — CBG MONITORING, ED: Glucose-Capillary: 95 mg/dL (ref 70–99)

## 2021-11-08 MED ORDER — LABETALOL HCL 5 MG/ML IV SOLN
10.0000 mg | Freq: Once | INTRAVENOUS | Status: AC
  Administered 2021-11-08: 10 mg via INTRAVENOUS
  Filled 2021-11-08: qty 4

## 2021-11-08 NOTE — ED Notes (Signed)
Pt transported to CT ?

## 2021-11-08 NOTE — ED Provider Triage Note (Signed)
Emergency Medicine Provider Triage Evaluation Note ? ?Janice [name] , a 81 y.o. female  was evaluated in triage.  Pt complains of brought to the emergency department by her daughter for a chief complaint of confusion.  Daughter reports that yesterday evening when talking to her mother on the phone she seemed confused and was taking a while to answer things.  Upon seeing her today she continues to noticed confusion and memory issues.  Reports that she had not seen her mother in person in approximately 2 to 3 weeks. ? ?Patient is alert to person, place, and time at this time.  Denies any recent falls or injuries.  Denies any complaints at this time. ? ?Review of Systems  ?Positive: Confusion ?Negative: Fever, chills, chest pain, shortness of breath, numbness, weakness, facial asymmetry, dysuria, urinary urgency, hematuria, abdominal pain, nausea, vomiting, diarrhea ? ?Physical Exam  ?BP (!) 228/116   Pulse 100   Temp 99.1 ?F (37.3 ?C) (Oral)   Resp 18   SpO2 96%  ?Gen:   Awake, no distress   ?Resp:  Normal effort  ?MSK:   Moves extremities without difficulty  ?Other:  CN II through XII intact.  +5 strength to bilateral upper and lower extremities.  Pronator drift negative.  Head is atraumatic. ? ?Medical Decision Making  ?Medically screening exam initiated at 9:20 PM.  Appropriate orders placed.  Janice [name] was informed that the remainder of the evaluation will be completed by another provider, this initial triage assessment does not replace that evaluation, and the importance of remaining in the ED until their evaluation is complete. ? ?Due to reports of confusion we will obtain noncontrast head CT scan as well as urinalysis, and lab testing. ?  ?Haskel Schroeder, PA-C ?11/08/21 2121 ? ?

## 2021-11-08 NOTE — ED Provider Notes (Signed)
?[facility] ?Provider Note ? ? ?CSN: [id number] ?Arrival date & time: 11/08/21  2053 ? ?  ? ?History ? ?Chief Complaint  ?Patient presents with  ? Altered Mental Status  ? ? ?Janice [name] is a 81 y.o. female. ? ?Janice [name] is a 81 y.o. female with a hx of Htn and anemia, who presents for evaluation of confusion. Pt is accompanied by her daughters, who help to provide history. Daughter reports that she spoke to her mom on the phone between 6 and 630 last night and she sounded normal, but later that evening the patient called one of her daughters and something sounded off.  They report that she was very hesitant and only speaking in one-word sentences and seemed to be a bit confused and not making sense.  They report that seem to worsen throughout the day today.  One daughter went and stayed with her last night after she tried to send an Ambulance and have her go to the hospital but the patient refused.  She did not notice any facial droop or unilateral weakness or numbness.  The patient was not very talkative throughout the day, but was not complaining of any pain.  They report the patient was having difficulty remembering things that she would usually be able to remember without any difficulty such as her grandchildren's names.  Patient is usually very diligent about taking her medications but was not sure if she had been taking them and specifically does not know if she took them today.  Patient denies any chest pain, shortness of breath, abdominal pain, nausea, vomiting, headache, numbness, weakness or vision changes.  She does report she feels like she is having difficulty remembering things.  Daughter noted some urinary frequency throughout the day today.  No fevers or recent illnesses.  No known falls or head trauma.  Not on any blood thinners. ? ?The history is provided by the patient and a relative.  ?Altered Mental Status ?Presenting symptoms: confusion    ?Associated symptoms: no abdominal pain, no fever, no headaches, no light-headedness, no nausea, no rash, no seizures, no vomiting and no weakness   ? ?  ? ?Home Medications ?Prior to Admission medications   ?Medication Sig Start Date End Date Taking? Authorizing Provider  ?Calcium Carbonate-Vitamin D (CALCIUM + D PO) Take 1 tablet by mouth 3 (three) times daily.    [provider]  ?cholecalciferol (VITAMIN D) 1000 UNITS tablet Take 1,000 Units by mouth daily.    [provider]  ?doxazosin (CARDURA) 4 MG tablet Take 4 mg by mouth at bedtime.    [provider]  ?fish oil-omega-3 fatty acids 1000 MG capsule Take 1 g by mouth daily.    [provider]  ?furosemide (LASIX) 20 MG tablet Take 10 mg by mouth every Monday, Wednesday, and Friday.    [provider]  ?hydroxypropyl methylcellulose (ISOPTO TEARS) 2.5 % ophthalmic solution Place 1 drop into both eyes 3 (three) times daily as needed. For dry eyes    [provider]  ?lisinopril-hydrochlorothiazide (PRINZIDE,ZESTORETIC) 20-12.5 MG per tablet Take 1 tablet by mouth daily.    [provider]  ?meloxicam (MOBIC) 15 MG tablet Take 15 mg by mouth daily.    [provider]  ?simvastatin (ZOCOR) 40 MG tablet Take 40 mg by mouth every other day. At bedtime    [provider]  ?   ? ?Allergies    ?Amlodipine   ? ?Review of  Systems   ?Review of Systems  ?Constitutional:  Negative for chills and fever.  ?HENT: Negative.    ?Respiratory:  Negative for cough and shortness of breath.   ?Cardiovascular:  Negative for chest pain.  ?Gastrointestinal:  Negative for abdominal pain, nausea and vomiting.  ?Genitourinary:  Positive for frequency. Negative for dysuria.  ?Musculoskeletal:  Negative for arthralgias and myalgias.  ?Skin:  Negative for color change and rash.  ?Neurological:  Positive for speech difficulty. Negative for dizziness, seizures, syncope, facial asymmetry, weakness,  light-headedness, numbness and headaches.  ?Psychiatric/Behavioral:  Positive for confusion.   ? ?Physical Exam ?Updated Vital Signs ?BP (!) 224/100   Pulse 72   Temp 99.1 ?F (37.3 ?C) (Oral)   Resp 16   SpO2 99%  ?Physical Exam ?Vitals and nursing note reviewed.  ?Constitutional:   ?   General: She is not in acute distress. ?   Appearance: Normal appearance. She is well-developed. She is not ill-appearing or diaphoretic.  ?   Comments: Elderly female, alert, in no acute distress  ?HENT:  ?   Head: Normocephalic and atraumatic.  ?   Mouth/Throat:  ?   Mouth: Mucous membranes are moist.  ?   Pharynx: Oropharynx is clear.  ?Eyes:  ?   General:     ?   Right eye: No discharge.     ?   Left eye: No discharge.  ?   Extraocular Movements: Extraocular movements intact.  ?   Conjunctiva/sclera: Conjunctivae normal.  ?   Pupils: Pupils are equal, round, and reactive to light.  ?Cardiovascular:  ?   Rate and Rhythm: Normal rate and regular rhythm.  ?   Pulses: Normal pulses.  ?   Heart sounds: Normal heart sounds. No murmur heard. ?  No friction rub. No gallop.  ?Pulmonary:  ?   Effort: Pulmonary effort is normal. No respiratory distress.  ?   Breath sounds: Normal breath sounds. No wheezing or rales.  ?   Comments: Respirations equal and unlabored, patient able to speak in full sentences, lungs clear to auscultation bilaterally  ?Chest:  ?   Chest wall: No tenderness.  ?Abdominal:  ?   General: Bowel sounds are normal. There is no distension.  ?   Palpations: Abdomen is soft. There is no mass.  ?   Tenderness: There is no abdominal tenderness. There is no guarding.  ?   Comments: Abdomen soft, nondistended, nontender to palpation in all quadrants without guarding or peritoneal signs  ?Musculoskeletal:     ?   General: No deformity.  ?   Cervical back: Neck supple. No tenderness.  ?   Right lower leg: No edema.  ?   Left lower leg: No edema.  ?Skin: ?   General: Skin is warm and dry.  ?   Capillary Refill: Capillary  refill takes less than 2 seconds.  ?Neurological:  ?   Mental Status: She is alert and oriented to person, place, and time.  ?   Coordination: Coordination normal.  ?   Comments: Speech is clear, able to follow commands, alert and oriented to person place and situation but struggles with time. ?CN III-XII intact ?Normal strength in upper and lower extremities bilaterally including dorsiflexion and plantar flexion, strong and equal grip strength ?Sensation normal to light and sharp touch ?Moves extremities without ataxia, coordination intact ?No pronator drift.  Normal finger-to-nose  ?Psychiatric:     ?   Mood and Affect: Mood normal.     ?  Behavior: Behavior normal.  ? ? ?ED Results / Procedures / Treatments   ?Labs ?(all labs ordered are listed, but only abnormal results are displayed) ?Labs Reviewed  ?COMPREHENSIVE METABOLIC PANEL - Abnormal; Notable for the following components:  ?    Result Value  ? Glucose, Bld 107 (*)   ? Creatinine, Ser 1.11 (*)   ? Total Protein 6.3 (*)   ? GFR, Estimated 50 (*)   ? All other components within normal limits  ?URINE CULTURE  ?CBC WITH DIFFERENTIAL/PLATELET  ?ETHANOL  ?AMMONIA  ?URINALYSIS, ROUTINE W REFLEX MICROSCOPIC  ?CBG MONITORING, ED  ?TROPONIN I (HIGH SENSITIVITY)  ? ? ?EKG ?EKG Interpretation ? ?Date/Time:  Tuesday Nov 09 2021 02:52:35 EDT ?Ventricular Rate:  66 ?PR Interval:  162 ?QRS Duration: 85 ?QT Interval:  432 ?QTC Calculation: 453 ?R Axis:   49 ?Text Interpretation: Sinus rhythm Supraventricular bigeminy Confirmed by Orpah Greek [id number]) on 11/09/2021 2:54:47 AM ? ?Radiology ?DG Chest 2 View ? ?Result Date: 11/08/2021 ?CLINICAL DATA:  Mental status change. EXAM: CHEST - 2 VIEW COMPARISON:  None Available. FINDINGS: The cardiomediastinal contours are normal. Mild aortic atherosclerosis. The lungs are clear. Pulmonary vasculature is normal. No consolidation, pleural effusion, or pneumothorax. No acute osseous abnormalities are seen. IMPRESSION: No acute  chest findings. Electronically Signed   By: Keith Rake M.D.   On: 11/08/2021 22:37  ? ?CT HEAD WO CONTRAST (5MM) ? ?Result Date: 11/08/2021 ?CLINICAL DATA:  Altered mental status. EXAM: CT HEAD WITHOUT CONTRAST TECHNIQUE: C

## 2021-11-08 NOTE — ED Triage Notes (Signed)
Pt brought to ED by daughter with  c/o of "not acting right", daughter states pt possibly displays new onset confusion and expressive aphasia, noticing symptoms yesterday evening. Daughter states prior to yesterday, her last known well interaction with mother was 2 or 3 weeks ago. Daughter also states ambulance went to residence yesterday and does not know what happened.  ?

## 2021-11-09 ENCOUNTER — Observation Stay (HOSPITAL_COMMUNITY): Payer: Medicare Other

## 2021-11-09 ENCOUNTER — Observation Stay (HOSPITAL_BASED_OUTPATIENT_CLINIC_OR_DEPARTMENT_OTHER): Payer: Medicare Other

## 2021-11-09 ENCOUNTER — Emergency Department (HOSPITAL_COMMUNITY): Payer: Medicare Other

## 2021-11-09 DIAGNOSIS — R4701 Aphasia: Secondary | ICD-10-CM

## 2021-11-09 DIAGNOSIS — I619 Nontraumatic intracerebral hemorrhage, unspecified: Secondary | ICD-10-CM | POA: Diagnosis not present

## 2021-11-09 DIAGNOSIS — I672 Cerebral atherosclerosis: Secondary | ICD-10-CM | POA: Diagnosis not present

## 2021-11-09 DIAGNOSIS — I63412 Cerebral infarction due to embolism of left middle cerebral artery: Secondary | ICD-10-CM

## 2021-11-09 DIAGNOSIS — I6623 Occlusion and stenosis of bilateral posterior cerebral arteries: Secondary | ICD-10-CM | POA: Diagnosis not present

## 2021-11-09 DIAGNOSIS — I639 Cerebral infarction, unspecified: Secondary | ICD-10-CM | POA: Diagnosis not present

## 2021-11-09 DIAGNOSIS — I6782 Cerebral ischemia: Secondary | ICD-10-CM | POA: Diagnosis not present

## 2021-11-09 DIAGNOSIS — I6389 Other cerebral infarction: Secondary | ICD-10-CM

## 2021-11-09 DIAGNOSIS — I6523 Occlusion and stenosis of bilateral carotid arteries: Secondary | ICD-10-CM | POA: Diagnosis not present

## 2021-11-09 DIAGNOSIS — I6602 Occlusion and stenosis of left middle cerebral artery: Secondary | ICD-10-CM | POA: Diagnosis not present

## 2021-11-09 LAB — ECHOCARDIOGRAM COMPLETE
AR max vel: 1.67 cm2
AV Peak grad: 6.7 mmHg
Ao pk vel: 1.3 m/s
Area-P 1/2: 3.63 cm2
Height: 64 in
MV M vel: 5.34 m/s
MV Peak grad: 114.1 mmHg
S' Lateral: 2.8 cm
Weight: 2190.49 oz

## 2021-11-09 LAB — COMPREHENSIVE METABOLIC PANEL
ALT: 16 U/L (ref 0–44)
AST: 18 U/L (ref 15–41)
Albumin: 3.6 g/dL (ref 3.5–5.0)
Alkaline Phosphatase: 52 U/L (ref 38–126)
Anion gap: 6 (ref 5–15)
BUN: 11 mg/dL (ref 8–23)
CO2: 26 mmol/L (ref 22–32)
Calcium: 9 mg/dL (ref 8.9–10.3)
Chloride: 110 mmol/L (ref 98–111)
Creatinine, Ser: 1.07 mg/dL — ABNORMAL HIGH (ref 0.44–1.00)
GFR, Estimated: 52 mL/min — ABNORMAL LOW (ref >60)
Glucose, Bld: 99 mg/dL (ref 70–99)
Potassium: 3.7 mmol/L (ref 3.5–5.1)
Sodium: 142 mmol/L (ref 135–145)
Total Bilirubin: 0.7 mg/dL (ref 0.3–1.2)
Total Protein: 5.9 g/dL — ABNORMAL LOW (ref 6.5–8.1)

## 2021-11-09 LAB — TROPONIN I (HIGH SENSITIVITY): Troponin I (High Sensitivity): 8 ng/L (ref <18)

## 2021-11-09 LAB — LIPID PANEL
Cholesterol: 207 mg/dL — ABNORMAL HIGH (ref 0–200)
HDL: 52 mg/dL (ref >40)
LDL Cholesterol: 141 mg/dL — ABNORMAL HIGH (ref 0–99)
Total CHOL/HDL Ratio: 4 RATIO
Triglycerides: 71 mg/dL (ref <150)
VLDL: 14 mg/dL (ref 0–40)

## 2021-11-09 LAB — HEMOGLOBIN A1C
Hgb A1c MFr Bld: 5.2 % (ref 4.8–5.6)
Mean Plasma Glucose: 102.54 mg/dL

## 2021-11-09 LAB — TSH: TSH: 4.766 u[IU]/mL — ABNORMAL HIGH (ref 0.350–4.500)

## 2021-11-09 MED ORDER — DOXAZOSIN MESYLATE 4 MG PO TABS
4.0000 mg | ORAL_TABLET | Freq: Every day | ORAL | Status: DC
  Administered 2021-11-09 – 2021-11-10 (×2): 4 mg via ORAL
  Filled 2021-11-09 (×3): qty 1

## 2021-11-09 MED ORDER — ENOXAPARIN SODIUM 40 MG/0.4ML IJ SOSY
40.0000 mg | PREFILLED_SYRINGE | INTRAMUSCULAR | Status: DC
  Administered 2021-11-09 – 2021-11-12 (×4): 40 mg via SUBCUTANEOUS
  Filled 2021-11-09 (×4): qty 0.4

## 2021-11-09 MED ORDER — HYDRALAZINE HCL 25 MG PO TABS
25.0000 mg | ORAL_TABLET | Freq: Three times a day (TID) | ORAL | Status: DC
  Administered 2021-11-09 – 2021-11-10 (×2): 25 mg via ORAL
  Filled 2021-11-09 (×2): qty 1

## 2021-11-09 MED ORDER — ACETAMINOPHEN 160 MG/5ML PO SOLN: 650.0000 mg | ORAL | Status: DC | PRN

## 2021-11-09 MED ORDER — CLOPIDOGREL BISULFATE 75 MG PO TABS
75.0000 mg | ORAL_TABLET | Freq: Every day | ORAL | Status: DC
Start: 2021-11-09 — End: 2021-11-12
  Administered 2021-11-09 – 2021-11-12 (×4): 75 mg via ORAL
  Filled 2021-11-09 (×4): qty 1

## 2021-11-09 MED ORDER — IOHEXOL 350 MG/ML SOLN
100.0000 mL | Freq: Once | INTRAVENOUS | Status: AC | PRN
  Administered 2021-11-09: 50 mL via INTRAVENOUS

## 2021-11-09 MED ORDER — ATORVASTATIN CALCIUM 80 MG PO TABS
80.0000 mg | ORAL_TABLET | Freq: Every day | ORAL | Status: DC
Start: 2021-11-09 — End: 2021-11-12
  Administered 2021-11-09 – 2021-11-12 (×4): 80 mg via ORAL
  Filled 2021-11-09: qty 1
  Filled 2021-11-09: qty 2
  Filled 2021-11-09 (×2): qty 1

## 2021-11-09 MED ORDER — METOPROLOL SUCCINATE ER 25 MG PO TB24
50.0000 mg | ORAL_TABLET | Freq: Every day | ORAL | Status: DC
  Administered 2021-11-09: 50 mg via ORAL
  Filled 2021-11-09: qty 2

## 2021-11-09 MED ORDER — ACETAMINOPHEN 325 MG PO TABS: 650.0000 mg | ORAL_TABLET | ORAL | Status: DC | PRN

## 2021-11-09 MED ORDER — ASPIRIN EC 81 MG PO TBEC
81.0000 mg | DELAYED_RELEASE_TABLET | Freq: Every day | ORAL | Status: DC
  Administered 2021-11-09 – 2021-11-12 (×4): 81 mg via ORAL
  Filled 2021-11-09 (×4): qty 1

## 2021-11-09 MED ORDER — LOSARTAN POTASSIUM 50 MG PO TABS
100.0000 mg | ORAL_TABLET | Freq: Every day | ORAL | Status: DC
  Administered 2021-11-09 – 2021-11-12 (×4): 100 mg via ORAL
  Filled 2021-11-09 (×4): qty 2

## 2021-11-09 MED ORDER — ACETAMINOPHEN 650 MG RE SUPP: 650.0000 mg | RECTAL | Status: DC | PRN

## 2021-11-09 MED ORDER — STROKE: EARLY STAGES OF RECOVERY BOOK
Freq: Once | Status: AC
  Filled 2021-11-09: qty 1

## 2021-11-09 NOTE — Progress Notes (Addendum)
STROKE TEAM PROGRESS NOTE  ? ?INTERVAL HISTORY ?Patient is seen in her room with one family member at the bedside.  On 11/07/21, she experienced sudden onset aphasia and was only able to speak in single words and short phrases  .  She presented to the ED the next day when symptoms did not resolve.  Patient states that her speech is more normal now but not yet back to baseline.  MRI shows ischemic stroke in the left basal ganglia.  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  LDL cholesterol elevated 141 mg percent.  Hemoglobin A1c is 5.2.  Echocardiogram is pending. ?Vitals:  ? 11/09/21 0630 11/09/21 0722 11/09/21 0800 11/09/21 0900  ?BP: (!) 194/70  (!) 170/73 (!) 163/91  ?Pulse: 60  60 61  ?Resp: 20  20 (!) 26  ?Temp:      ?TempSrc:      ?SpO2: 96%  96% 97%  ?Weight:  62.1 kg    ?Height:  5\' 4"  (1.626 m)    ? ?CBC:  ?Recent Labs  ?Lab 11/08/21 ?2136  ?WBC 6.0  ?NEUTROABS 3.3  ?HGB 13.8  ?HCT 40.9  ?MCV 88.5  ?PLT 192  ? ?Basic Metabolic Panel:  ?Recent Labs  ?Lab 11/08/21 ?2136 11/09/21 ?0510  ?NA 141 142  ?K 3.6 3.7  ?CL 108 110  ?CO2 25 26  ?GLUCOSE 107* 99  ?BUN 13 11  ?CREATININE 1.11* 1.07*  ?CALCIUM 9.5 9.0  ? ?Lipid Panel:  ?Recent Labs  ?Lab 11/09/21 ?0510  ?CHOL 207*  ?TRIG 71  ?HDL 52  ?CHOLHDL 4.0  ?VLDL 14  ?LDLCALC 141*  ? ?HgbA1c:  ?Recent Labs  ?Lab 11/09/21 ?0510  ?HGBA1C 5.2  ? ?Urine Drug Screen: No results for input(s): LABOPIA, COCAINSCRNUR, LABBENZ, AMPHETMU, THCU, LABBARB in the last 168 hours.  ?Alcohol Level  ?Recent Labs  ?Lab 11/08/21 ?2136  ?ETH <10  ? ? ?IMAGING past 24 hours ?CT ANGIO HEAD NECK W WO CM ? ?Result Date: 11/09/2021 ?CLINICAL DATA:  Altered mental status with acute stroke suspected EXAM: CT ANGIOGRAPHY HEAD AND NECK TECHNIQUE: Multidetector CT imaging of the head and neck was performed using the standard protocol during bolus administration of intravenous contrast. Multiplanar CT image reconstructions and MIPs were obtained to evaluate the vascular anatomy.  Carotid stenosis measurements (when applicable) are obtained utilizing NASCET criteria, using the distal internal carotid diameter as the denominator. RADIATION DOSE REDUCTION: This exam was performed according to the departmental dose-optimization program which includes automated exposure control, adjustment of the mA and/or kV according to patient size and/or use of iterative reconstruction technique. CONTRAST:  50mL OMNIPAQUE IOHEXOL 350 MG/ML SOLN COMPARISON:  None Available. FINDINGS: CTA NECK FINDINGS Aortic arch: Atheromatous plaque with 3 vessel branching. Right carotid system: Motion artifact. Mixed density plaque at the bifurcation. No flow limiting stenosis suspected when accounting for motion artifact. Negative for ulceration Left carotid system: Atheromatous wall thickening, mainly calcified at the bifurcation. Motion artifact with no flow limiting stenosis or suspected ulceration. Vertebral arteries: Proximal subclavian atherosclerosis without flow limiting stenosis. The vertebral arteries are smoothly contoured and diffusely patent Skeleton: No acute finding.  Ordinary disc and facet degeneration Other neck: No acute finding Upper chest: No acute finding Review of the MIP images confirms the above findings CTA HEAD FINDINGS Anterior circulation: Atheromatous plaque affecting the carotid siphons. Focal advanced stenosis at the left MCA bifurcation. No central clot/embolic like appearance. Atheromatous irregularity of medium size vessels bilaterally, especially bilateral ACA. Posterior circulation: Atheromatous  plaque affecting the left more than right V4 segments. The basilar is smoothly contoured and diffusely patent. Atheromatous irregularity of the bilateral posterior cerebral arteries with severe stenosis at the left P3 segment and right P2 segment. Venous sinuses: No acute finding Anatomic variants: None significant Review of the MIP images confirms the above findings IMPRESSION: 1. Intracranial  atherosclerosis with advanced left M1, right P2, and left P3 segment stenoses. 2. Atherosclerosis in the neck without flow limiting stenosis. Motion artifact affects the carotid bifurcations. Electronically Signed   By: Tiburcio Pea M.D.   On: 11/09/2021 07:30  ? ?DG Chest 2 View ? ?Result Date: 11/08/2021 ?CLINICAL DATA:  Mental status change. EXAM: CHEST - 2 VIEW COMPARISON:  None Available. FINDINGS: The cardiomediastinal contours are normal. Mild aortic atherosclerosis. The lungs are clear. Pulmonary vasculature is normal. No consolidation, pleural effusion, or pneumothorax. No acute osseous abnormalities are seen. IMPRESSION: No acute chest findings. Electronically Signed   By: Narda Rutherford M.D.   On: 11/08/2021 22:37  ? ?CT HEAD WO CONTRAST ( ) ? ?Result Date: 11/08/2021 ?CLINICAL DATA:  Altered mental status. EXAM: CT HEAD WITHOUT CONTRAST TECHNIQUE: Contiguous axial images were obtained from the base of the skull through the vertex without intravenous contrast. RADIATION DOSE REDUCTION: This exam was performed according to the departmental dose-optimization program which includes automated exposure control, adjustment of the mA and/or kV according to patient size and/or use of iterative reconstruction technique. COMPARISON:  None Available. FINDINGS: Brain: There is mild age-related atrophy and moderate chronic microvascular ischemic changes. There is no acute intracranial hemorrhage. No mass effect or midline shift. No extra-axial fluid collection. Vascular: No hyperdense vessel or unexpected calcification. Skull: Normal. Negative for fracture or focal lesion. Sinuses/Orbits: No acute finding. Other: None IMPRESSION: 1. No acute intracranial pathology. 2. Age-related atrophy and chronic microvascular ischemic changes. Electronically Signed   By: Elgie Collard M.D.   On: 11/08/2021 22:46  ? ?MR BRAIN WO CONTRAST ? ?Result Date: 11/09/2021 ?CLINICAL DATA:  Initial evaluation for acute neuro deficit,  stroke suspected. EXAM: MRI HEAD WITHOUT CONTRAST TECHNIQUE: Multiplanar, multiecho pulse sequences of the brain and surrounding structures were obtained without intravenous contrast. COMPARISON:  Prior CT from 11/08/2021. FINDINGS: Brain: Cerebral volume within normal limits. Patchy and confluent T2/FLAIR hyperintensity involving the periventricular deep white matter both cerebral hemispheres as well as the pons, most consistent with chronic small vessel ischemic disease, moderately advanced. Small remote lacunar infarct present at the right thalamus. Patchy restricted diffusion seen involving the left basal ganglia, with involvement of the left caudate and lentiform nuclei, consistent with acute ischemic infarct. No associated hemorrhage or mass effect. Area of infarction measures up to approximately 3 cm in maximal diameter. No other evidence for acute or subacute ischemia. Gray-white matter differentiation otherwise maintained. No acute intracranial hemorrhage. Few small punctate chronic micro hemorrhages noted, likely small vessel/hypertensive in nature. No mass lesion, mass effect, or midline shift. No hydrocephalus or extra-axial fluid collection. Pituitary gland suprasellar region normal. Vascular: Major intracranial vascular flow voids are maintained. Skull and upper cervical spine: Craniocervical junction within normal limits. Bone marrow signal intensity normal. No scalp soft tissue abnormality. Sinuses/Orbits: Prior ocular lens replacement. No significant mastoid effusion. Paranasal sinuses are largely clear. Other: None. IMPRESSION: 1. Patchy acute ischemic nonhemorrhagic left basal ganglia infarct. 2. Underlying moderate chronic microvascular ischemic disease. Small remote lacunar infarct at the right thalamus. Electronically Signed   By: Rise Mu M.D.   On: 11/09/2021 02:28   ? ?PHYSICAL EXAM ?  General:  Alert, well-nourished, well-developed pleasant Caucasian elderly patient in no acute  distress ?Respiratory:  Regular, unlabored respirations on room air ? ?NEURO:  ?Mental Status: AA&Ox3  ?Speech/Language: speech is hesitant slightly nonfluent with mild dysarthria and expressive aphasia.  Nam

## 2021-11-09 NOTE — Consult Note (Signed)
NEUROLOGY CONSULTATION NOTE  ? ?Date of service: Nov 09, 2021 ?Patient Name: Janice [name] ?MRN:  [id number] ?DOB:  31-Oct-1940 ?Reason for consult: "L BG stroke" ?Requesting Provider: Zenia Resides, MD ?_ _ _   _ __   _ __ _ _  __ __   _ __   __ _ ? ?History of Present Illness  ?Janice [name] is a 81 y.o. female with PMH significant for anemia, HTN who presents with 1 day hx of dysarthria and aphasia. She spoke to daughter on 11/07/21 in the evening around 1830 and was fine. Later, noted to be speaking in single word or short phrase by daughter over phone and slurring her speech. Despite request by family to go to the ED, patient declined. Her symptoms persisted so she eventually came to the ED where MRI Brain demonstrated left BG ischemic strokes. ? ?No prior hx of strokes, no family hx of strokes, does not smoke, no hx of DM2, no hx of Atrial fibrillation. Family reports reaction to a BP med but unclear what med it was. They think it is Lisinopril and BP meds were switched. ? ?LKW: [id number] on 11/07/21. ?mRS: 0 ?tNKASE: not offered, outside window. ?Thrombectomy: not offered, outside window. ?NIHSS components Score: Comment  ?1a Level of Conscious 0[x]  1[]  2[]  3[]      ?1b LOC Questions 0[x]  1[]  2[]       ?1c LOC Commands 0[]  1[x]  2[]       ?2 Best Gaze 0[x]  1[]  2[]       ?3 Visual 0[x]  1[]  2[]  3[]      ?4 Facial Palsy 0[x]  1[]  2[]  3[]      ?5a Motor Arm - left 0[x]  1[]  2[]  3[]  4[]  UN[]    ?5b Motor Arm - Right 0[x]  1[]  2[]  3[]  4[]  UN[]    ?6a Motor Leg - Left 0[x]  1[]  2[]  3[]  4[]  UN[]    ?6b Motor Leg - Right 0[x]  1[]  2[]  3[]  4[]  UN[]    ?7 Limb Ataxia 0[x]  1[]  2[]  3[]  UN[]     ?8 Sensory 0[x]  1[]  2[]  UN[]      ?9 Best Language 0[]  1[x]  2[]  3[]      ?10 Dysarthria 0[x]  1[]  2[]  UN[]      ?11 Extinct. and Inattention 0[x]  1[]  2[]       ?TOTAL: 2   ?  ?ROS  ? ?Unable to obtain an accurate review of system 2/2 aphasia. ? ?Past History  ? ?Past Medical History:  ?Diagnosis Date  ? Anemia   ? History of blood transfusion   ? no  reaction  ? Hypertension   ? ?Past Surgical History:  ?Procedure Laterality Date  ? CATARACT EXTRACTION W/PHACO  01/23/2012  ? Procedure: CATARACT EXTRACTION PHACO AND INTRAOCULAR LENS PLACEMENT (IOC);  Surgeon: Tonny Branch, MD;  Location: [facility];  Service: Ophthalmology;  Laterality: Right;  CDE 18.26  ? CATARACT EXTRACTION W/PHACO  02/13/2012  ? Procedure: CATARACT EXTRACTION PHACO AND INTRAOCULAR LENS PLACEMENT (IOC);  Surgeon: Tonny Branch, MD;  Location: [facility];  Service: Ophthalmology;  Laterality: Left;  CDE:14.14  ? CHOLECYSTECTOMY  1981  ? DILATION AND CURETTAGE OF UTERUS    ? TONSILLECTOMY  age 32  ? TUBAL LIGATION    ? ?History reviewed. No pertinent family history. ?Social History  ? ?Socioeconomic History  ? Marital status: Widowed  ?  Spouse name: Not on file  ? Number of children: Not on file  ? Years of education: Not on file  ? Highest education level: Not on file  ?Occupational History  ?  Not on file  ?Tobacco Use  ? Smoking status: Never  ? Smokeless tobacco: Not on file  ?Substance and Sexual Activity  ? Alcohol use: No  ? Drug use: No  ? Sexual activity: Not on file  ?Other Topics Concern  ? Not on file  ?Social History Narrative  ? Not on file  ? ?Social Determinants of Health  ? ?Financial Resource Strain: Not on file  ?Food Insecurity: Not on file  ?Transportation Needs: Not on file  ?Physical Activity: Not on file  ?Stress: Not on file  ?Social Connections: Not on file  ? ?Allergies  ?Allergen Reactions  ? Amlodipine Swelling  ? ? ?Medications  ?(Not in a hospital admission) ?  ? ?Vitals  ? ?Vitals:  ? 11/09/21 0245 11/09/21 0315 11/09/21 0330 11/09/21 0400  ?BP: (!) 207/92 (!) 194/84 (!) 188/117 (!) 176/84  ?Pulse: (!) 39 71 78 76  ?Resp: (!) 21 18 (!) 21 20  ?Temp:      ?TempSrc:      ?SpO2: 96% 97% 97% 98%  ?  ? ?There is no height or weight on file to calculate BMI. ? ?Physical Exam  ? ?General: Laying comfortably in bed; in no acute distress.  ?HENT: Normal oropharynx and mucosa. Normal  external appearance of ears and nose.  ?Neck: Supple, no pain or tenderness  ?CV: No JVD. No peripheral edema.  ?Pulmonary: Symmetric Chest rise. Normal respiratory effort.  ?Abdomen: Soft to touch, non-tender.  ?Ext: No cyanosis, edema, or deformity  ?Skin: No rash. Normal palpation of skin.   ?Musculoskeletal: Normal digits and nails by inspection. No clubbing.  ? ?Neurologic Examination  ?Mental status/Cognition: Alert, oriented to self, place, month and year, good attention.  ?Speech/language: Non fluent and speaks in 1 word or short phrase, word salad when attempted to speak fluently. However no errors when she speaks in 1 word or short phrase. Comprehension intact to most commands and accuracy goes up when given cues, object naming intact, repetition intact.  ?Cranial nerves:  ? CN II Pupils equal and reactive to light, no VF deficits   ? CN III,IV,VI EOM intact, no gaze preference or deviation, no nystagmus   ? CN V normal sensation in V1, V2, and V3 segments bilaterally   ? CN VII no asymmetry, no nasolabial fold flattening   ? CN VIII normal hearing to speech   ? CN IX & X normal palatal elevation, no uvular deviation   ? CN XI 5/5 head turn and 5/5 shoulder shrug bilaterally   ? CN XII midline tongue protrusion   ? ?Motor:  ?Muscle bulk: normal, tone normal, pronator drift none tremor none ?Mvmt Root Nerve  Muscle Right Left Comments  ?SA C5/6 Ax Deltoid     ?EF C5/6 Mc Biceps 5 5   ?EE C6/7/8 Rad Triceps 5 5   ?WF C6/7 Med FCR     ?WE C7/8 PIN ECU     ?F Ab C8/T1 U ADM/FDI 5 5   ?HF L1/2/3 Fem Illopsoas 5 5   ?KE L2/3/4 Fem Quad 5 5   ?DF L4/5 D Peron Tib Ant 5 5   ?PF S1/2 Tibial Grc/Sol 5 5   ? ?Sensation: ? Light touch Intact throughout  ? Pin prick   ? Temperature   ? Vibration   ?Proprioception   ? ?Coordination/Complex Motor:  ?- Finger to Nose intact BL ?- Heel to shin intact BL ?- Rapid alternating movement are normal ?- Gait: deferred. ? ?Labs  ? ?  CBC:  ?Recent Labs  ?Lab 11/08/21 ?2136  ?WBC 6.0   ?NEUTROABS 3.3  ?HGB 13.8  ?HCT 40.9  ?MCV 88.5  ?PLT 192  ? ? ?Basic Metabolic Panel:  ?Lab Results  ?Component Value Date  ? NA 141 11/08/2021  ? K 3.6 11/08/2021  ? CO2 25 11/08/2021  ? GLUCOSE 107 (H) 11/08/2021  ? BUN 13 11/08/2021  ? CREATININE 1.11 (H) 11/08/2021  ? CALCIUM 9.5 11/08/2021  ? GFRNONAA 50 (L) 11/08/2021  ? GFRAA 48 (L) 01/19/2012  ? ?Lipid Panel: No results found for: [place] ?HgbA1c: No results found for: HGBA1C ?Urine Drug Screen: No results found for: LABOPIA, COCAINSCRNUR, [place], [place], [facility], LABBARB  ?Alcohol Level  ?   ?Component Value Date/Time  ? ETH <10 11/08/2021 2136  ? ? ?CT Head without contrast(Personally reviewed): ?CTH was negative for a large hypodensity concerning for a large territory infarct or hyperdensity concerning for an ICH ? ?CT angio Head and Neck with contrast: ?pending ? ?MRI Brain(Personally reviewed): ?Patchy L BG strokes ? ? ?Impression  ? ?Janice [name] is a 81 y.o. female with PMH significant for nemia, HTN who presents with 1 day hx of dysarthria and aphasia. Found to have patchy left BG stroke. Her neurologic examination is notable for non fluent speech, almost word salad if attempting to speak slowly, also trouble with comprehending complex commands but able to follow simple commands. No other focal deficit on exam. ? ?Stroke appears embolic in nature, unlikely for a small vessel stroke to be this large and patchy. ?Recommendations  ? ?- Frequent Neuro checks per stroke unit protocol ?- Recommend brain imaging with MRI Brain without contrast ?- Recommend Vascular imaging with CTA H + N. ?- Recommend obtaining TTE ?- Recommend obtaining Lipid panel with LDL ?- Please start statin if LDL > 70 ?- Recommend HbA1c ?- Antithrombotic - Aspirin 81mg  daily along with plavix 75mg  daily x 21 days followed by Aspirin 81mg  daily alone. ?- Recommend DVT ppx ?- SBP goal - permissive hypertension first 24 h < 220/110. Held home meds.  ?- Recommend Telemetry  monitoring for arrythmia ?- Recommend bedside swallow screen prior to PO intake. ?- Stroke education booklet ?- Recommend PT/OT/SLP consult ?- stroke team to follow along. ? ?_______________________________________

## 2021-11-09 NOTE — Evaluation (Signed)
Physical Therapy Evaluation ?Patient Details ?Name: Janice [name] ?MRN: [id number] ?DOB: 05/23/1941 ?Today's Date: 11/09/2021 ? ?History of Present Illness ? QUASHA SPEIDEL is a 81 y.o. 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 Found this admission: Bigeminy and possible CKD. PMH is significant for hypertension, remotely hyperlipidemia and tobacco use.  ?Clinical Impression ? Pt admitted secondary to problem above with deficits below. Speech difficulty noted throughout and pt with some difficulty dual tasking. Noted instability with DGI tasks requiring up to min A for steadying. Pt's daughters report they can assist as needed. Feel pt would benefit from outpatient PT to address current balance deficits. Will continue to follow acutely.    ?   ? ?Recommendations for follow up therapy are one component of a multi-disciplinary discharge planning process, led by the attending physician.  Recommendations may be updated based on patient status, additional functional criteria and insurance authorization. ? ?Follow Up Recommendations Outpatient PT (would like a clinic closer to her home) ? ?  ?Assistance Recommended at Discharge Frequent or constant Supervision/Assistance  ?Patient can return home with the following ? A little help with bathing/dressing/bathroom;Assistance with cooking/housework;Direct supervision/assist for medications management;Direct supervision/assist for financial management;Assist for transportation;Help with stairs or ramp for entrance ? ?  ?Equipment Recommendations None recommended by PT  ?Recommendations for Other Services ?    ?  ?Functional Status Assessment Patient has had a recent decline in their functional status and demonstrates the ability to make significant improvements in function in a reasonable and predictable amount of time.  ? ?  ?Precautions / Restrictions Precautions ?Precautions:  Fall ?Restrictions ?Weight Bearing Restrictions: No  ? ?  ? ?Mobility ? Bed Mobility ?Overal bed mobility: Needs Assistance ?Bed Mobility: Sit to Supine ?  ?  ?  ?Sit to supine: Supervision ?  ?General bed mobility comments: Supervision for safety. ?  ? ?Transfers ?Overall transfer level: Needs assistance ?Equipment used: None ?Transfers: Sit to/from Stand ?Sit to Stand: Min guard ?  ?  ?  ?  ?  ?General transfer comment: Min guard for safety ?  ? ?Ambulation/Gait ?Ambulation/Gait assistance: Min guard, Min assist ?Gait Distance (Feet): 120 Feet ?Assistive device: None ?Gait Pattern/deviations: Step-through pattern, Decreased stride length, Drifts right/left ?Gait velocity: Decreased ?  ?  ?General Gait Details: Increased unsteadiness noted with horizontal and vertical head turns. Had some difficulty with dual tasking ? ?Stairs ?  ?  ?  ?  ?  ? ?Wheelchair Mobility ?  ? ?Modified Rankin (Stroke Patients Only) ?Modified Rankin (Stroke Patients Only) ?Pre-Morbid Rankin Score: No symptoms ?Modified Rankin: Moderately severe disability ? ?  ? ?Balance Overall balance assessment: Needs assistance ?Sitting-balance support: No upper extremity supported, Feet supported ?Sitting balance-Leahy Scale: Good ?  ?  ?Standing balance support: No upper extremity supported ?Standing balance-Leahy Scale: Fair ?  ?  ?  ?  ?  ?  ?  ?  ?  ?Standardized Balance Assessment ?Standardized Balance Assessment : Dynamic Gait Index ?  ?Dynamic Gait Index ?Level Surface: Mild Impairment ?Gait with Horizontal Head Turns: Moderate Impairment ?Gait with Vertical Head Turns: Moderate Impairment ?Gait and Pivot Turn: Mild Impairment ?Step Over Obstacle: Moderate Impairment ?Step Around Obstacles: Mild Impairment ?   ? ? ? ?Pertinent Vitals/Pain Pain Assessment ?Pain Assessment: No/denies pain  ? ? ?Home Living Family/patient expects to be discharged to:: Private residence ?Living Arrangements: Alone ?Available Help at Discharge: Family;Available 24  hours/day ?Type of Home: House ?  Home Access: Stairs to enter ?Entrance Stairs-Rails: Right;Left ?Entrance Stairs-Number of Steps: 3 ?  ?Home Layout: One level ?Home Equipment: None ?   ?  ?Prior Function Prior Level of Function : Independent/Modified Independent;Driving ?  ?  ?  ?  ?  ?  ?Mobility Comments: mows yard with push mower ?  ?  ? ? ?Hand Dominance  ? Dominant Hand: Right ? ?  ?Extremity/Trunk Assessment  ? Upper Extremity Assessment ?Upper Extremity Assessment: Defer to OT evaluation ?  ? ?Lower Extremity Assessment ?Lower Extremity Assessment: Generalized weakness ?  ? ?Cervical / Trunk Assessment ?Cervical / Trunk Assessment: Normal  ?Communication  ? Communication: Expressive difficulties  ?Cognition Arousal/Alertness: Awake/alert ?Behavior During Therapy: [facility] for tasks assessed/performed ?Overall Cognitive Status: Difficult to assess ?Area of Impairment: Following commands, Awareness ?  ?  ?  ?  ?  ?  ?  ?  ?  ?  ?  ?Following Commands: Follows one step commands with increased time ?  ?Awareness: Intellectual ?  ?General Comments: Decreased communication. Consistent with yes and no; but not with open ended questions ?  ?  ? ?  ?General Comments   ? ?  ?Exercises    ? ?Assessment/Plan  ?  ?PT Assessment Patient needs continued PT services  ?PT Problem List Decreased strength;Decreased balance;Decreased activity tolerance;Decreased mobility;Decreased knowledge of use of DME;Decreased knowledge of precautions;Decreased safety awareness;Decreased cognition ? ?   ?  ?PT Treatment Interventions DME instruction;Gait training;Functional mobility training;Therapeutic activities;Therapeutic exercise;Stair training;Balance training;Patient/family education;Cognitive remediation;Neuromuscular re-education   ? ?PT Goals (Current goals can be found in the Care Plan section)  ?Acute Rehab PT Goals ?Patient Stated Goal: to go home ?PT Goal Formulation: With patient/family ?Time For Goal Achievement:  11/23/21 ?Potential to Achieve Goals: Good ? ?  ?Frequency Min 3X/week ?  ? ? ?Co-evaluation   ?  ?  ?  ?  ? ? ?  ?AM-PAC PT "6 Clicks" Mobility  ?Outcome Measure Help needed turning from your back to your side while in a flat bed without using bedrails?: None ?Help needed moving from lying on your back to sitting on the side of a flat bed without using bedrails?: A Little ?Help needed moving to and from a bed to a chair (including a wheelchair)?: A Little ?Help needed standing up from a chair using your arms (e.g., wheelchair or bedside chair)?: A Little ?Help needed to walk in hospital room?: A Little ?Help needed climbing 3-5 steps with a railing? : A Little ?6 Click Score: 19 ? ?  ?End of Session Equipment Utilized During Treatment: Gait belt ?Activity Tolerance: Patient tolerated treatment well ?Patient left: in bed;with call bell/phone within reach;with family/visitor present (on stretcher in ED) ?Nurse Communication: Mobility status ?PT Visit Diagnosis: Unsteadiness on feet (R26.81);Muscle weakness (generalized) (M62.81) ?  ? ?Time: [phone] ?PT Time Calculation (min) (ACUTE ONLY): 12 min ? ? ?Charges:   PT Evaluation ?$PT Eval Low Complexity: 1 Low ?  ?  ?   ? ? ?Reuel Derby, PT, DPT  ?Acute Rehabilitation Services  ?Pager: [phone] ?Office: [phone] ? ? ?[facility] [place] ?11/09/2021, 11:53 AM ?

## 2021-11-09 NOTE — Hospital Course (Addendum)
Janice [name] is a 81 y.o. female presenting with aphasia and dysarthria. PMH is significant for hypertension, remotely hyperlipidemia and tobacco use. ?  ?CVA  L. Basal Ganglia Stroke ?Patient presented with aphasia and dysarthria; after prolonged period of symptom on-start, (went to sleep after symptom onset, and went to ED following morning).  CT head showed no acute intracranial abnormalities.  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 CBC and BMP in ED within normal limits. Neurology was consulted and patient was started on ASA 81 and Plavix 75. She was to continue both for 3 months and then ASA alone. PT/OT saw and evaluated patient and felt she was benefit from continued outpatient PT/OT. Patient had no FND on discharge. ? ?HLD ?Patient had Total Chol 207, and LDL 141, with history of high intensity statin, appears as though compliance has been an issue. Patient was encouraged to take statin at d/c ? ?Bigeminy ?EKG shows sinus rhythm with bigeminy on admission, with HR ranging from 60-70.  Does not report history of any cardiac problems.  She remained stable throughout admission. Patient had Zio Patch sent to home.  ? ? ?Discharge follow up considerations: ?PCP to refer patient for outpatient PT/OT and speech therapies.  ?Lipid panel high on admission. Total 207, LDL 141.Started atorvastatin 80 mg daily prior to discharge, tolerated well. PCP to recheck LDL in 3 months.  ?Patient likely not taking statin, encourage in outpatient setting ?TSH on admission 4.766, demonstrates subclinical hypothyroidism, cannot diagnose in acute period. PCP to follow and repeat TSH in 6 weeks.  ?Follow up with cardiology for HFpEF on echo in outpatient ?Patient had Zio patch sent to home, will need follow up for placement and return.  ?Unsure why pt is on high dose doxazosin, would consider stopping and starting more traditional anti-hypertensive  ?Held metoprolol, consider restarting  outpatient ?Reduced doxazocin to 2mg  ?

## 2021-11-09 NOTE — H&P (Signed)
Family Medicine Teaching Service ?Hospital Admission History and Physical ?Service Pager: [phone] ? ?Patient name: Janice [name] Medical record number: [id number] ?Date of birth: 09/16/1940 Age: 81 y.o. Gender: female ? ?Primary Care Provider: Neale Burly, MD ?Consultants: Neuro ?Code Status: Full code ?Preferred Emergency Contact: Olegario Shearer, daughter ? ?Chief Complaint: aphasia and dysarthria ? ?Assessment and Plan: ?Janice [name] is a 81 y.o. female presenting with aphasia and dysarthria. PMH is significant for hypertension, remotely hyperlipidemia and tobacco use. ? ?Stroke ?81 yo woman presented with 1 day of aphasia and dysarthria; daughter reports improvement in dysarthria since presentation to the ED. CT head showed no acute intracranial abnormalities.  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  Chest x-ray without acute findings. Troponins trended flat. CBC and BMP in ED within normal limits, see kidney function problem below.  EtOH less than 10. Exam revealed some continued aphasia, no dysarthria, and slight weakness in RUE as compared to left. Patient does have history of hypertension, remote history of smoking, unclear cardiac risk factors.  Neurology consulted, appreciate recommendations. Plan to perform stroke work up.  Patient passed bedside swallow test. ?- Admit to FPTS, attending Dr. Andria Frames ?- Neurology consulted, appreciate recommendations ?- Continuous cardiac monitoring ?- Obtain echocardiogram ?- Risk stratification labs: Hemoglobin A1c, lipid panel, TSH ?- Healthy diet ?- Lovenox for DVT PPx ?- PT/OT/SLP eval and treat ? ?Hypertension ?Patient has history of hypertension.  Current medications are losartan 100 mg daily, metoprolol succinate 100 mg daily, doxazosin 4 mg nightly.  Patient had stroke, greater than 1 day ago.  Blood pressures have been in severely elevated range: 192 to 230s/90s-120s.  By report patient has been off of her home  medications for several days, unclear how long, but suspected from the weekend to now.  Patient is greater then 24 hours since last known normal, while permissive hypertension is recommended, goal is to keep SBP less than 200 and DBP less than 110.  ?- Continue to monitor ?- Continue losartan 100 mg daily ?- Start metoprolol succinate 50 mg daily ?- Hold doxazosin ? ?Possible CKD 3a ?Serum creatinine in the ED today was 1.11 or EGFR 50.  Baseline is unknown however at last admission in epic and it July 2013, patient had serum creatinine of 1.26.  Consequently serum creatinine of 1.11 could be patient's baseline, however unclear. ?- Monitor with daily BMP ? ?Bigeminy ?EKG shows sinus rhythm with bigeminy, HR ranging from 60-70.  Does not report history of any cardiac problems.  Will monitor cardiac rhythm with continuous cardiac monitoring given stroke as above.  Discussed metoprolol succinate dosing with pharmacy given overall lower range heart rate and bigeminy.  Will decrease dose of metoprolol succinate and monitor closely. ?- Continuous cardiac monitoring ?- Metoprolol succinate 50 mg daily ? ?Hyperlipidemia ?On chart review, patient was on simvastatin in 2013.  However patient is currently not on a statin.  No other medical history available per chart review and patient does not recall having high cholesterol.  On chest x-ray obtained in ED aortic atherosclerosis noted on read. ?-Obtain lipid panel, CMP as above ? ?FEN/GI: NPO until passes stroke swallow screen ?Prophylaxis: Lovenox ? ?Disposition: med telemetry for stroke work up ? ?History of Present Illness:  Janice [name] is a 81 y.o. female presenting with dysarthria and aphasia.  PMH is significant for hypertension, hyperlipidemia. ? ?80 year old woman who lives alone, and performs all of her ADLs and IADLs, presents today with 1 day of  dysarthria and aphasia.  On May 7 around 6 PM she had a phone conversation with one of her daughters that was  completely normal.  Later that night, she spoke with her daughter again and patient was not able to answer in complete sentences, some of her words were slurred, she was answering in only 1 word answers.  Her daughter tried to convince patient to go to the hospital, even called an ambulance and EMS was dispatched to the home.  Patient declines transport and evaluation at the hospital.  Then yesterday during the day the slurred speech and single word answers and difficulty with sentences persisted and her daughter insisted on patient being examined tonight.  In the emergency department patient had a CT head noncontrast that showed no acute intracranial changes.  MRI brain did show an acute left basal ganglia infarct.  On chart review there is not much medical history, remote history shows that patient was previously on a statin, however her daughter brings her medications today and shows that she is only on losartan 100 mg daily, Toprol XL 100 mg daily, doxazosin 4 mg nightly. ? ?Patient quit smoking many years ago, greater than 20 years ago.  She reports that she did smoke 1 pack/day prior to that, unable to state exactly how many years.  She does not consume alcohol or any other recreational drugs.  Lives alone and performs all of her own ADLs and IADLs.  Her daughters and patient reports that she has been in good health and is usually alert and oriented fully.  Tonight patient is alert and oriented to self, place/circumstance, but not time.  Her daughter reports that her mom usually puts her medications into a weekly pill box to remind herself to take her medications.  However yesterday when he went to see her mother she noticed that the pill box was full and that none of her medications have been taken.  Mother could not tell her when she took her medications last, but they think ? ?Review Of Systems: Per HPI with the following additions:  ? ?Review of Systems  ?Constitutional:  Negative for activity change,  chills and fever.  ?HENT:  Negative for congestion, rhinorrhea and sore throat.   ?Respiratory:  Negative for cough, shortness of breath and wheezing.   ?Cardiovascular:  Negative for chest pain, palpitations and leg swelling.  ?Gastrointestinal:  Negative for abdominal distention, abdominal pain, constipation, diarrhea, nausea and vomiting.  ?Neurological:  Positive for speech difficulty. Negative for dizziness, seizures, syncope, facial asymmetry, weakness, light-headedness, numbness and headaches.  ?All other systems reviewed and are negative.  ? ?There are no problems to display for this patient. ? ? ?Past Medical History: ?Past Medical History:  ?Diagnosis Date  ? Anemia   ? History of blood transfusion   ? no reaction  ? Hypertension   ? ? ?Past Surgical History: ?Past Surgical History:  ?Procedure Laterality Date  ? CATARACT EXTRACTION W/PHACO  01/23/2012  ? Procedure: CATARACT EXTRACTION PHACO AND INTRAOCULAR LENS PLACEMENT (IOC);  Surgeon: Tonny Branch, MD;  Location: [facility];  Service: Ophthalmology;  Laterality: Right;  CDE 18.26  ? CATARACT EXTRACTION W/PHACO  02/13/2012  ? Procedure: CATARACT EXTRACTION PHACO AND INTRAOCULAR LENS PLACEMENT (IOC);  Surgeon: Tonny Branch, MD;  Location: [facility];  Service: Ophthalmology;  Laterality: Left;  CDE:14.14  ? CHOLECYSTECTOMY  1981  ? DILATION AND CURETTAGE OF UTERUS    ? TONSILLECTOMY  age 34  ? TUBAL LIGATION    ? ? ?  Social History: ?Social History  ? ?Tobacco Use  ? Smoking status: Never  ?Substance Use Topics  ? Alcohol use: No  ? Drug use: No  ? ?Additional social history: See HPI ?Please also refer to relevant sections of EMR. ? ?Family History: ?History reviewed. No pertinent family history. ? ?Allergies and Medications: ?Allergies  ?Allergen Reactions  ? Amlodipine Swelling  ? ?No current facility-administered medications on file prior to encounter.  ? ?Current Outpatient Medications on File Prior to Encounter  ?Medication Sig Dispense Refill  ? Calcium  Carbonate-Vitamin D (CALCIUM + D PO) Take 1 tablet by mouth 3 (three) times daily.    ? cholecalciferol (VITAMIN D) 1000 UNITS tablet Take 1,000 Units by mouth daily.    ? doxazosin (CARDURA) 4 MG tablet Take 4 mg by mouth at bed

## 2021-11-09 NOTE — Progress Notes (Addendum)
Family Medicine Teaching Service ?Daily Progress Note ?Intern Pager: [phone] ? ?Patient name: Janice [name] Medical record number: [id number] ?Date of birth: 21-Oct-1940 Age: 81 y.o. Gender: female ? ?Primary Care Provider: Neale Burly, MD ?Consultants: Neuro ?Code Status: Full ? ?Pt Overview and Major Events to Date:  ?5/9 - Pt admitted ? ?Assessment and Plan: ? ?Janice [name] is a 81 y.o. female presenting with aphasia and dysarthria. 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 ?  ?Basal Ganglia Stroke ?Echo showed EF 55-60% w/ grade II diastolic dysfunction. Neuro exam unchanged from yesterday. Neurology recommending ASA 81 and plavix 75 for 3 months, then continuous ASA. PT recommending outpatient PT, SLP recommending outpatient ST.  ?-Neurology following, appreciate recs ?-Continuous cardiac monitoring ?-aspirin 81 mg, Plavix 75 mg ?-LDL goal less than 70 ?-Q4h Neuro checks ?-Up with assistance ?-PT/OT/SLP ? ? ?Hypertension ?Permissive hypertension to 210/120 should end today, BP range 140-220's/60-90's overnight, patient dropped from [id number] systolic to [id number] systolic with hydralazine. Hydralazine held, 2/2 to large drop in BP, may consider prn.  ?-Consider Hydralazine 25 prn ?-Losartan 100 mg daily ?-Doxazosin 4 mg qhs ? ? ?Possible CKD 3a ?Cr: 1.00 (1.07), stable ?-continue to monitor ? ? ?Hyperlipidemia ?Lipid panel: T. Chol 207, LDL 141, Tri 71 ?-atorvastatin 80 mg ? ? ? ?FEN/GI: Heart Healthy ?PPx: Lovenox ?Dispo:Home today.   ? ?Subjective:  ?Patient with no complaints today, is doing ok. ? ? ?Objective: ?Temp:  [98 ?F (36.7 ?C)-99.1 ?F (37.3 ?C)] 98 ?F (36.7 ?C) (05/09 0230) ?Pulse Rate:  [39-100] 61 (05/09 0600) ?Resp:  [15-25] 21 (05/09 0600) ?BP: (156-237)/(65-122) 156/65 (05/09 0600) ?SpO2:  [93 %-99 %] 94 % (05/09 0600) ?Physical Exam: ?General: Well appearing, frail, elderly, polite, cooperative ?Cardiovascular: RRR, NRMG ?Respiratory:  CTAB ?Abdomen: Soft, nttp, non-distended ?Extremities: No edema ?Neuro: Strength 5/5, EOMI intact, sensation intact grossly, tongue midline, no aphasia appreciated, some dysarthria ? ?Laboratory: ?Recent Labs  ?Lab 11/08/21 ?2136  ?WBC 6.0  ?HGB 13.8  ?HCT 40.9  ?PLT 192  ? ?Recent Labs  ?Lab 11/08/21 ?2136 11/09/21 ?0510  ?NA 141 142  ?K 3.6 3.7  ?CL 108 110  ?CO2 25 26  ?BUN 13 11  ?CREATININE 1.11* 1.07*  ?CALCIUM 9.5 9.0  ?PROT 6.3* 5.9*  ?BILITOT 0.7 0.7  ?ALKPHOS 53 52  ?ALT 16 16  ?AST 18 18  ?GLUCOSE 107* 99  ? ? ? ? ?Imaging/Diagnostic Tests: ? ? ?Holley Bouche, MD ?11/09/2021, 7:02 AM ?PGY-1, [facility] ?[place] Intern pager: [phone], text pages welcome ? ?

## 2021-11-09 NOTE — Progress Notes (Signed)
Speech Language Pathology Evaluation ?Patient Details ?Name: Janice [name] ?MRN: [id number] ?DOB: 09-13-1940 ?Today's Date: 11/09/2021 ?Time: [id number] ?SLP Time Calculation (min) (ACUTE ONLY): 20 min ? ?Problem List:  ?Patient Active Problem List  ? Diagnosis Date Noted  ? Acute stroke of basal ganglia ([place]) 11/09/2021  ? Aphasia   ? ?Past Medical History:  ?Past Medical History:  ?Diagnosis Date  ? Anemia   ? History of blood transfusion   ? no reaction  ? Hypertension   ? ?Past Surgical History:  ?Past Surgical History:  ?Procedure Laterality Date  ? CATARACT EXTRACTION W/PHACO  01/23/2012  ? Procedure: CATARACT EXTRACTION PHACO AND INTRAOCULAR LENS PLACEMENT (IOC);  Surgeon: Tonny Branch, MD;  Location: [facility];  Service: Ophthalmology;  Laterality: Right;  CDE 18.26  ? CATARACT EXTRACTION W/PHACO  02/13/2012  ? Procedure: CATARACT EXTRACTION PHACO AND INTRAOCULAR LENS PLACEMENT (IOC);  Surgeon: Tonny Branch, MD;  Location: [facility];  Service: Ophthalmology;  Laterality: Left;  CDE:14.14  ? CHOLECYSTECTOMY  1981  ? DILATION AND CURETTAGE OF UTERUS    ? TONSILLECTOMY  age 51  ? TUBAL LIGATION    ? ?HPI:  ?Janice [name] is a 81 y.o. 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 Found this admission: Bigeminy and possible CKD. PMH is significant for hypertension, remotely hyperlipidemia and tobacco use.  ? ?Assessment / Plan / Recommendation ?Clinical Impression ? Pt exhibited mild aphasia, dysarthria and cognitive impairments. Pt is able to repeat sentences, no difficulty initiating language, can name common objects to 100%. She demonstrated anomia and phonemic and semantic errors without awareness. Oriented to year, but not month (may have been d/t lang). Responses inculding numbers were difficult for her. Overall comprehension for basic information was adequate with 100% simple yes/no questions, Suspect she may have difficulty with  higher level or abstract language concepts. Speech was mildly dysarhthric marked by imprecise articulation and low vocal intensity. From cognitive perspective pt with decreased awareness to errors, functional pragmatics and attention to therapists. ST will continue to see while in acute care and agree with PT for outpatient ST upon discharge. ?   ?SLP Assessment ? SLP Recommendation/Assessment: Patient needs continued [facility] ?SLP Visit Diagnosis: Aphasia (R47.01);Dysarthria and anarthria (R47.1);Cognitive communication deficit (R41.841)  ?  ?Recommendations for follow up therapy are one component of a multi-disciplinary discharge planning process, led by the attending physician.  Recommendations may be updated based on patient status, additional functional criteria and insurance authorization. ?   ?Follow Up Recommendations ? Outpatient SLP  ?  ?Assistance Recommended at Discharge ?    ?Functional Status Assessment Patient has had a recent decline in their functional status and demonstrates the ability to make significant improvements in function in a reasonable and predictable amount of time.  ?Frequency and Duration min 2x/week  ?2 weeks ?  ?   ?SLP Evaluation ?Cognition ? Overall Cognitive Status: Impaired/Different from baseline ?Arousal/Alertness: Awake/alert ?Orientation Level: Oriented to person;Disoriented to time (disoriented to city) ?Year: 2023 ?Month: March ?Attention: Sustained ?Sustained Attention: Appears intact ?Memory:  (TBD) ?Awareness: Impaired ?Awareness Impairment: Emergent impairment ?Problem Solving:  (no difficulties noted, will continue to assess in diagnostic tx) ?Safety/Judgment: Impaired (will assess further)  ?  ?   ?Comprehension ? Auditory Comprehension ?Overall Auditory Comprehension: Appears within functional limits for tasks assessed (suspect higher level impairments) ?Yes/No Questions: Within Functional Limits (7/7 simple) ?Commands:  (followed one  step, will assess for multistep) ?Visual Recognition/Discrimination ?  Discrimination: Not tested ?Reading Comprehension ?Reading Status:  (TBA)  ?  ?Expression Expression ?Primary Mode of Expression: Verbal ?Verbal Expression ?Overall Verbal Expression: Impaired ?Initiation: No impairment ?Level of Generative/Spontaneous Verbalization: Phrase ?Repetition: No impairment ?Naming: No impairment ?Pragmatics: No impairment ?Written Expression ?Dominant Hand: Right ?Written Expression:  (TBD)   ?Oral / Motor ? Oral Motor/Sensory Function ?Overall Oral Motor/Sensory Function: Within functional limits ?Motor Speech ?Overall Motor Speech: Impaired ?Respiration: Within functional limits ?Phonation: Low vocal intensity ?Resonance: Within functional limits ?Articulation: Impaired ?Level of Impairment: Phrase ?Intelligibility: Intelligibility reduced ?Word: 75-100% accurate ?Phrase: 75-100% accurate ?Sentence: 75-100% accurate ?Motor Planning: Witnin functional limits   ?        ? ?Janice [name] ?11/09/2021, 12:57 PM ? ? ?

## 2021-11-09 NOTE — Evaluation (Signed)
Occupational Therapy Evaluation ?Patient Details ?Name: Janice [name] ?MRN: [id number] ?DOB: [birth year] ?Today's Date: 11/09/2021 ? ? ?History of Present Illness Janice [name] is a 81 y.o. 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 Found this admission: Bigeminy and possible CKD. PMH is significant for hypertension, remotely hyperlipidemia and tobacco use.  ? ?Clinical Impression ?  ?This 81 yo female admitted with above presents to acute OT with PLOF of being totally independent with basic ADLs, IADLs, driving, and maintaining her yard with a push mower. Currently she has mild higher level balance deficits thus making her less safe with all daily activities that she is accustomed to doing. She will benefit from one more session of acute OT prior to D/C.  ?   ? ?Recommendations for follow up therapy are one component of a multi-disciplinary discharge planning process, led by the attending physician.  Recommendations may be updated based on patient status, additional functional criteria and insurance authorization.  ? ?Follow Up Recommendations ? No OT follow up  ?  ?Assistance Recommended at Discharge Frequent or constant Supervision/Assistance (due to communication deficits)  ?Patient can return home with the following A little help with bathing/dressing/bathroom;A lot of help with walking and/or transfers;Help with stairs or ramp for entrance;Assist for transportation;Assistance with cooking/housework;Direct supervision/assist for financial management;Direct supervision/assist for medications management ? ?  ?Functional Status Assessment ? Patient has had a recent decline in their functional status and demonstrates the ability to make significant improvements in function in a reasonable and predictable amount of time.  ?Equipment Recommendations ? None recommended by OT  ?  ?   ?Precautions / Restrictions  Precautions ?Precautions: Fall ?Restrictions ?Weight Bearing Restrictions: No  ? ?  ? ?Mobility Bed Mobility ?Overal bed mobility: Needs Assistance ?Bed Mobility: Supine to Sit ?  ?  ?Supine to sit: Supervision ?  ?  ?  ?  ? ?Transfers ?Overall transfer level: Needs assistance ?Equipment used: None ?Transfers: Sit to/from Stand ?Sit to Stand: Min guard ?  ?  ?  ?  ?  ?  ?  ? ?  ?Balance Overall balance assessment: Mild deficits observed, not formally tested ?  ?  ?  ?  ?  ?  ?  ?  ?  ?  ?  ?  ?  ?  ?  ?  ?  ?  ?   ? ?ADL either performed or assessed with clinical judgement  ? ?ADL Overall ADL's : Needs assistance/impaired ?Eating/Feeding: Independent;Sitting ?  ?Grooming: Set up;Sitting ?  ?Upper Body Bathing: Set up;Sitting ?  ?Lower Body Bathing: Min guard;Sit to/from stand ?  ?Upper Body Dressing : Set up;Sitting ?  ?Lower Body Dressing: Min guard;Sit to/from stand ?  ?Toilet Transfer: Min guard;Ambulation (no AD) ?  ?Toileting- Architect and Hygiene: Min guard;Sit to/from stand ?  ?  ?  ?  ?   ? ? ? ?Vision Baseline Vision/History: 1 Wears glasses ?Ability to See in Adequate Light: 0 Adequate ?Patient Visual Report: No change from baseline ?   ?   ?   ?   ? ?Pertinent Vitals/Pain Pain Assessment ?Pain Assessment: No/denies pain  ? ? ? ?Hand Dominance Right ?  ?Extremity/Trunk Assessment Upper Extremity Assessment ?Upper Extremity Assessment: Overall WFL for tasks assessed ?  ?  ?  ?  ?  ?Communication Communication ?Communication: No difficulties ?  ?Cognition Arousal/Alertness: Awake/alert ?Behavior During Therapy: [facility] for tasks assessed/performed ?Overall Cognitive Status:  Impaired/Different from baseline ?Area of Impairment: Following commands, Awareness ?  ?  ?  ?  ?  ?  ?  ?  ?  ?  ?  ?Following Commands: Follows one step commands consistently (if added gestures as well) ?  ?Awareness: Intellectual ?  ?General Comments: Decreased communication. Consistent with yes and no; but not with open ended  questions ?  ?  ?   ?   ?   ? ? ?Home Living Family/patient expects to be discharged to:: Private residence ?Living Arrangements: Alone ?Available Help at Discharge: Family;Available 24 hours/day (initially) ?Type of Home: House ?Home Access: Stairs to enter ?Entrance Stairs-Number of Steps: 3 ?Entrance Stairs-Rails: Right;Left ?Home Layout: One level ?  ?  ?Bathroom Shower/Tub: Tub/shower unit;Curtain ?  ?Bathroom Toilet: Standard ?  ?  ?Home Equipment: None ?  ?  ?  ? ?  ?Prior Functioning/Environment Prior Level of Function : Independent/Modified Independent;Driving ?  ?  ?  ?  ?  ?  ?Mobility Comments: mows yard with push mower ?  ?  ? ?  ?  ?OT Problem List: Impaired balance (sitting and/or standing);Decreased cognition ?  ?   ?OT Treatment/Interventions: Self-care/ADL training;DME and/or AE instruction;Patient/family education;Balance training  ?  ?OT Goals(Current goals can be found in the care plan section) Acute Rehab OT Goals ?Patient Stated Goal: get back to active life ?OT Goal Formulation: With patient/family ?Time For Goal Achievement: 11/23/21 ?Potential to Achieve Goals: Good  ?OT Frequency: Min 2X/week ?  ? ?   ?AM-PAC OT "6 Clicks" Daily Activity     ?Outcome Measure Help from another person eating meals?: None ?Help from another person taking care of personal grooming?: A Little ?Help from another person toileting, which includes using toliet, bedpan, or urinal?: A Little ?Help from another person bathing (including washing, rinsing, drying)?: A Little ?Help from another person to put on and taking off regular upper body clothing?: A Little ?Help from another person to put on and taking off regular lower body clothing?: A Little ?6 Click Score: 19 ?  ?End of Session Equipment Utilized During Treatment: Gait belt ? ?Activity Tolerance: Patient tolerated treatment well ?Patient left:  (EOB getting ready to work with PT) ? ?OT Visit Diagnosis: Unsteadiness on feet (R26.81);Other abnormalities of gait  and mobility (R26.89);Other symptoms and signs involving cognitive function  ?              ?Time: 1610-9604 ?OT Time Calculation (min): 21 min ?Charges:  OT General Charges ?$OT Visit: 1 Visit ?OT Evaluation ?$OT Eval Moderate Complexity: 1 Mod ? ?Ignacia Palma, OTR/L ?Acute Rehab Services ?Pager [phone] ?Office [phone] ? ? ? ?Evette Georges ?11/09/2021, 10:03 AM ?

## 2021-11-09 NOTE — ED Notes (Signed)
Patient transported to CT 

## 2021-11-10 ENCOUNTER — Encounter: Payer: Self-pay | Admitting: *Deleted

## 2021-11-10 ENCOUNTER — Other Ambulatory Visit: Payer: Self-pay | Admitting: Physician Assistant

## 2021-11-10 DIAGNOSIS — I5032 Chronic diastolic (congestive) heart failure: Secondary | ICD-10-CM | POA: Diagnosis present

## 2021-11-10 DIAGNOSIS — I639 Cerebral infarction, unspecified: Secondary | ICD-10-CM

## 2021-11-10 DIAGNOSIS — Z87891 Personal history of nicotine dependence: Secondary | ICD-10-CM | POA: Diagnosis not present

## 2021-11-10 DIAGNOSIS — I69359 Hemiplegia and hemiparesis following cerebral infarction affecting unspecified side: Secondary | ICD-10-CM

## 2021-11-10 DIAGNOSIS — Z79899 Other long term (current) drug therapy: Secondary | ICD-10-CM | POA: Diagnosis not present

## 2021-11-10 DIAGNOSIS — I4891 Unspecified atrial fibrillation: Secondary | ICD-10-CM

## 2021-11-10 DIAGNOSIS — Z6823 Body mass index (BMI) 23.0-23.9, adult: Secondary | ICD-10-CM | POA: Diagnosis not present

## 2021-11-10 DIAGNOSIS — R471 Dysarthria and anarthria: Secondary | ICD-10-CM | POA: Diagnosis present

## 2021-11-10 DIAGNOSIS — I63512 Cerebral infarction due to unspecified occlusion or stenosis of left middle cerebral artery: Secondary | ICD-10-CM | POA: Diagnosis present

## 2021-11-10 DIAGNOSIS — R410 Disorientation, unspecified: Secondary | ICD-10-CM | POA: Diagnosis present

## 2021-11-10 DIAGNOSIS — E785 Hyperlipidemia, unspecified: Secondary | ICD-10-CM | POA: Diagnosis present

## 2021-11-10 DIAGNOSIS — Z888 Allergy status to other drugs, medicaments and biological substances status: Secondary | ICD-10-CM | POA: Diagnosis not present

## 2021-11-10 DIAGNOSIS — G8311 Monoplegia of lower limb affecting right dominant side: Secondary | ICD-10-CM | POA: Diagnosis not present

## 2021-11-10 DIAGNOSIS — I130 Hypertensive heart and chronic kidney disease with heart failure and stage 1 through stage 4 chronic kidney disease, or unspecified chronic kidney disease: Secondary | ICD-10-CM | POA: Diagnosis present

## 2021-11-10 DIAGNOSIS — Z23 Encounter for immunization: Secondary | ICD-10-CM | POA: Diagnosis present

## 2021-11-10 DIAGNOSIS — R008 Other abnormalities of heart beat: Secondary | ICD-10-CM | POA: Diagnosis present

## 2021-11-10 DIAGNOSIS — I700 Atherosclerosis of aorta: Secondary | ICD-10-CM | POA: Diagnosis present

## 2021-11-10 DIAGNOSIS — G936 Cerebral edema: Secondary | ICD-10-CM | POA: Diagnosis not present

## 2021-11-10 DIAGNOSIS — R29810 Facial weakness: Secondary | ICD-10-CM | POA: Diagnosis present

## 2021-11-10 DIAGNOSIS — R54 Age-related physical debility: Secondary | ICD-10-CM | POA: Diagnosis present

## 2021-11-10 DIAGNOSIS — N1831 Chronic kidney disease, stage 3a: Secondary | ICD-10-CM | POA: Diagnosis present

## 2021-11-10 DIAGNOSIS — D649 Anemia, unspecified: Secondary | ICD-10-CM | POA: Diagnosis present

## 2021-11-10 DIAGNOSIS — R4701 Aphasia: Secondary | ICD-10-CM | POA: Diagnosis present

## 2021-11-10 DIAGNOSIS — R29702 NIHSS score 2: Secondary | ICD-10-CM | POA: Diagnosis present

## 2021-11-10 LAB — BASIC METABOLIC PANEL
Anion gap: 6 (ref 5–15)
BUN: 12 mg/dL (ref 8–23)
CO2: 25 mmol/L (ref 22–32)
Calcium: 9.1 mg/dL (ref 8.9–10.3)
Chloride: 108 mmol/L (ref 98–111)
Creatinine, Ser: 1 mg/dL (ref 0.44–1.00)
GFR, Estimated: 57 mL/min — ABNORMAL LOW (ref >60)
Glucose, Bld: 106 mg/dL — ABNORMAL HIGH (ref 70–99)
Potassium: 3.5 mmol/L (ref 3.5–5.1)
Sodium: 139 mmol/L (ref 135–145)

## 2021-11-10 LAB — URINE CULTURE

## 2021-11-10 MED ORDER — PNEUMOCOCCAL 20-VAL CONJ VACC 0.5 ML IM SUSY
0.5000 mL | PREFILLED_SYRINGE | INTRAMUSCULAR | Status: AC
  Administered 2021-11-11: 0.5 mL via INTRAMUSCULAR
  Filled 2021-11-10: qty 0.5

## 2021-11-10 NOTE — Progress Notes (Signed)
30 day monitor request received by [employer], have placed order for this, Rosann Auerbach will message our monitor team to arrange. Cardiology f/u also arranged and placed on AVS. Will include info that monitor will be mailed to home. ?

## 2021-11-10 NOTE — Progress Notes (Signed)
STROKE TEAM PROGRESS NOTE  ? ?INTERVAL HISTORY ?Patient is seen in her room .  She states her speech is improving.  Echocardiogram shows normal ejection fraction without cardiac source of embolism.  Patient denies history of A-fib or cardiac arrhythmias. ?Vitals:  ? 11/10/21 [id number] 11/10/21 0625 11/10/21 0812 11/10/21 1059  ?BP: 126/61  (!) 151/70 137/68  ?Pulse: 60  72 72  ?Resp: 20  18 16   ?Temp:  98.4 ?F (36.9 ?C)  98.1 ?F (36.7 ?C)  ?TempSrc:  Oral  Oral  ?SpO2: 96%  100% 98%  ?Weight:      ?Height:      ? ?CBC:  ?Recent Labs  ?Lab 11/08/21 ?2136  ?WBC 6.0  ?NEUTROABS 3.3  ?HGB 13.8  ?HCT 40.9  ?MCV 88.5  ?PLT 192  ? ?Basic Metabolic Panel:  ?Recent Labs  ?Lab 11/09/21 ?0510 11/10/21 ?0342  ?NA 142 139  ?K 3.7 3.5  ?CL 110 108  ?CO2 26 25  ?GLUCOSE 99 106*  ?BUN 11 12  ?CREATININE 1.07* 1.00  ?CALCIUM 9.0 9.1  ? ?Lipid Panel:  ?Recent Labs  ?Lab 11/09/21 ?0510  ?CHOL 207*  ?TRIG 71  ?HDL 52  ?CHOLHDL 4.0  ?VLDL 14  ?[place] 141*  ? ?HgbA1c:  ?Recent Labs  ?Lab 11/09/21 ?0510  ?HGBA1C 5.2  ? ?Urine Drug Screen: No results for input(s): LABOPIA, COCAINSCRNUR, LABBENZ, AMPHETMU, THCU, LABBARB in the last 168 hours.  ?Alcohol Level  ?Recent Labs  ?Lab 11/08/21 ?2136  ?ETH <10  ? ? ?IMAGING past 24 hours ?No results found. ? ?PHYSICAL EXAM ?General:  Alert, well-nourished, well-developed pleasant Caucasian elderly patient in no acute distress ?Respiratory:  Regular, unlabored respirations on room air ? ?NEURO:  ?Mental Status: AA&Ox3  ?Speech/Language: speech is hesitant slightly nonfluent with mild dysarthria and expressive aphasia.  Naming and repetition are intact today, and speech is nonfluent. ? ?Cranial Nerves:  ?II: PERRL. Visual fields full. ?III, IV, VI: EOMI. Eyelids elevate symmetrically.  ?V: Sensation is intact to light touch and symmetrical to face.  ?VII: Smile is symmetrical.  ?VIII: hearing intact to voice. ?IX, X: Phonation is normal.  ?XII: tongue is midline without fasciculations. ?Motor: 5/5  strength to all muscle groups tested.  ?Sensation- Intact to light touch bilaterally.  ?Coordination: FTN intact bilaterally.  No drift.  ?Gait- deferred ? ? NIHSS 2 premorbid modified Rankin score 0 ? ?ASSESSMENT/PLAN ?Janice [name] is a 81 y.o. female with history of anemia and HTN presenting with sudden onset aphasia.  She presented to the ED the  day aftr her symptoms appeared, because symptoms did not resolve.  Patient states that her speech is more normal now but not yet back to baseline.  MRI shows ischemic stroke in the left basal ganglia.  She denies a history of atrial fibrillation. ? 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 Small vessel disease. Atrophy.  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  Grade 2 diastolic dysfunction. ?LDL 141 ?HgbA1c 5.2 ?VTE prophylaxis - lovenox ?   ?Diet  ? Diet Heart Room service appropriate? Yes; Fluid consistency: Thin  ? ?No antithrombotic prior to admission, now on aspirin 81 mg daily and clopidogrel 75 mg daily.  ?Will plan for loop recorder at discharge as stroke appears embolic ?Therapy recommendations:  no OT follow up, PT pending ?Disposition:  pending, likely home ? ?Hypertension ?Home meds:  doxazosin 4 mg daily, lisinopril-HCTZ 20-12.5 daily ?Stable ?Permissive hypertension (OK if < 220/120) but gradually normalize in 5-7 days ?Long-term BP goal normotensive ? ?Hyperlipidemia ?Home meds:  simvastatin 40 mg daily, changed to atorvastatin 80 mg daily ?LDL 141, goal < 70 ?Continue statin at discharge ? ? ?Other Stroke Risk Factors ?Advanced Age >/= 62  ? ?Other Active Problems ?none ? ? Patient presented with sudden onset of expressive aphasia which is mild and improving and MRI scan shows a fairly large patchy basal ganglia infarct with CT angiogram  showing left MCA stenosis likely symptomatic.  Recommend aspirin and Plavix for 3 months followed by aspirin alone and aggressive risk factor modification.  Statin for elevated lipids.  Speech therapy consults.  Continue ongoing stroke work-up.  No family available at the bedside for discussion.  Patient may consider possible participation in the Moro trial if interested.  No family available at the bedside for discussion.  Greater than 50% time during this 35-minute visit was spent in counseling coronation of care about the stroke and stenosis in discussion about stroke prevention and treatment and answering questions.  Stroke team will sign off.  Follow-up as an outpatient stroke clinic in 2 months. ? ?Antony Contras, MD ?Medical Director ?[facility] ?Pager: [phone] ?11/10/2021 1:14 PM ? ? ?To contact Stroke Continuity provider, please refer to [email or web address] ?After hours, contact General Neurology  ?

## 2021-11-10 NOTE — TOC Initial Note (Signed)
Transition of Care (TOC) - Initial/Assessment Note  ? ? ?Patient Details  ?Name: Janice [name] ?MRN: [id number] ?Date of Birth: 1940-07-28 ? ?Transition of Care (TOC) CM/SW Contact:    ?Pollie Friar, RN ?Phone Number: ?11/10/2021, 2:12 PM ? ?Clinical Narrative:                 ?CM met with the patient and her family at he bedside. Pt lives alone but family is going to stay with her after discharge. They state they have access to walker/ shower seat and 3 in 1.  ?Family is able to provide needed transportation and oversee her medications at home.  ?Recommendations are for outpatient therapy. They prefer outpatient near [place]. Outpatient arranged through [facility]. Information on the AVS. ?Family will transport home at d/c.  ? ?Expected Discharge Plan: OP Rehab ?Barriers to Discharge: Continued Medical Work up ? ? ?Patient Goals and CMS Choice ?  ?CMS Medicare.gov Compare Post Acute Care list provided to:: Patient ?Choice offered to / list presented to : Patient, Adult Children ? ?Expected Discharge Plan and Services ?Expected Discharge Plan: OP Rehab ?  ?Discharge Planning Services: CM Consult ?  ?Living arrangements for the past 2 months: [place] ?                ?  ?  ?  ?  ?  ?  ?  ?  ?  ?  ? ?Prior Living Arrangements/Services ?Living arrangements for the past 2 months: [place] ?Lives with:: Self ?Patient language and need for interpreter reviewed:: Yes ?Do you feel safe going back to the place where you live?: Yes      ?Need for Family Participation in Patient Care: Yes (Comment) ?Care giver support system in place?: Yes (comment) ?Current home services: DME (family has 2 walkers/ shower seat/ 3 in 1) ?Criminal Activity/Legal Involvement Pertinent to Current Situation/Hospitalization: No - Comment as needed ? ?Activities of Daily Living ?Home Assistive Devices/Equipment: Gilford Rile (specify type), Cane (specify quad or straight), Bedside commode/3-in-1, Other (Comment) (Per pt  and daughter Janice [name].) ?ADL Screening (condition at time of admission) ?Patient's cognitive ability adequate to safely complete daily activities?: Yes ?Is the patient deaf or have difficulty hearing?: No ?Does the patient have difficulty seeing, even when wearing glasses/contacts?: No ?Does the patient have difficulty concentrating, remembering, or making decisions?: No ?Patient able to express need for assistance with ADLs?: Yes ?Does the patient have difficulty dressing or bathing?: No ?Independently performs ADLs?: Yes (appropriate for developmental age) ?Does the patient have difficulty walking or climbing stairs?: No ?Weakness of Legs: Right ?Weakness of Arms/Hands: Right ? ?Permission Sought/Granted ?  ?  ?   ?   ?   ?   ? ?Emotional Assessment ?Appearance:: Appears stated age ?  ?  ?Orientation: : Oriented to Self, Oriented to Place ?  ?Psych Involvement: No (comment) ? ?Admission diagnosis:  Stroke [facility]) [I63.9] ?Acute stroke of basal ganglia (HCC) [I63.9] ?Patient Active Problem List  ? Diagnosis Date Noted  ? Stroke ([place]) 11/10/2021  ? Acute stroke of basal ganglia ([place]) 11/09/2021  ? Aphasia   ? ?PCP:  Neale Burly, MD ?Pharmacy:   ?[facility] [place] ?[place] [address] ?Phone: [phone] Fax: [phone] ? ? ? ? ?Social Determinants of Health (SDOH) Interventions ?  ? ?Readmission Risk Interventions ?   ? View : No data to display.  ?  ?  ?  ? ? ? ?

## 2021-11-10 NOTE — ED Notes (Signed)
Breakfast orders placed 

## 2021-11-10 NOTE — ED Notes (Signed)
ED TO INPATIENT HANDOFF REPORT ? ?ED Nurse Name and Phone #: Libbie Bartley [phone] ? ?S ?Name/Age/Gender ?Janice [name] ?81 y.o. ?female ?Room/Bed: 042C/042C ? ?Code Status ?  Code Status: Full Code ? ?Home/SNF/Other ?Home ?Patient oriented to: self and place ?Is this baseline? No  ? ?Triage Complete: Sheria Langriage complete  ?Chief Complaint ?Stroke [facility](HCC) [I63.9] ? ?Triage Note ? Pt brought to ED by daughter with  c/o of "not acting right", daughter states pt possibly displays new onset confusion and expressive aphasia, noticing symptoms yesterday evening. Daughter states prior to yesterday, her last known well interaction with mother was 2 or 3 weeks ago. Daughter also states ambulance went to residence yesterday and does not know what happened.   ? ?Allergies ?Allergies  ?Allergen Reactions  ? Norvasc [Amlodipine] Swelling  ? ? ?Level of Care/Admitting Diagnosis ?ED Disposition   ? ? ED Disposition  ?Admit  ? Condition  ?--  ? Comment  ?[facility] [[id number][facility] ? Level of Care: Telemetry Medical [104] ? [facility] if equivalent level of care is available:: No ? Covid Evaluation: Asymptomatic - no recent exposure (last 10 days) testing not required ? Diagnosis: Stroke [facility](HCC) [[id number]] ? Admitting Physician: Shirlean MylarMAHONEY, CAITLIN [[id number]] ? Attending Physician: Doralee AlbinoHENSEL, WILLIAM A [[id number]] ?  ?  ? ?  ? ? ?B ?Medical/Surgery History ?Past Medical History:  ?Diagnosis Date  ? Anemia   ? History of blood transfusion   ? no reaction  ? Hypertension   ? ?Past Surgical History:  ?Procedure Laterality Date  ? CATARACT EXTRACTION W/PHACO  01/23/2012  ? Procedure: CATARACT EXTRACTION PHACO AND INTRAOCULAR LENS PLACEMENT (IOC);  Surgeon: Gemma PayorKerry Hunt, MD;  Location: [facility];  Service: Ophthalmology;  Laterality: Right;  CDE 18.26  ? CATARACT EXTRACTION W/PHACO  02/13/2012  ? Procedure: CATARACT EXTRACTION PHACO AND INTRAOCULAR LENS PLACEMENT (IOC);  Surgeon: Gemma PayorKerry Hunt,  MD;  Location: [facility];  Service: Ophthalmology;  Laterality: Left;  CDE:14.14  ? CHOLECYSTECTOMY  1981  ? DILATION AND CURETTAGE OF UTERUS    ? TONSILLECTOMY  age 89  ? TUBAL LIGATION    ?  ? ?A ?IV Location/Drains/Wounds ?Patient Lines/Drains/Airways Status   ? ? Active Line/Drains/Airways   ? ? Name Placement date Placement time Site Days  ? Peripheral IV 11/08/21 20 G Anterior;Right Forearm 11/08/21  2327  Forearm  2  ? Incision 01/23/12 Eye Right 01/23/12  1316  -- 3579  ? Incision 02/13/12 Eye Left 02/13/12  0814  -- 3558  ? ?  ?  ? ?  ? ? ?Intake/Output Last 24 hours ?No intake or output data in the 24 hours ending 11/10/21 0958 ? ?Labs/Imaging ?Results for orders placed or performed during the hospital encounter of 11/08/21 (from the past 48 hour(s))  ?CBG monitoring, ED     Status: None  ? Collection Time: 11/08/21  9:05 PM  ?Result Value Ref Range  ? Glucose-Capillary 95 70 - 99 mg/dL  ?  Comment: Glucose reference range applies only to samples taken after fasting for at least 8 hours.  ?Comprehensive metabolic panel     Status: Abnormal  ? Collection Time: 11/08/21  9:36 PM  ?Result Value Ref Range  ? Sodium 141 135 - 145 mmol/L  ? Potassium 3.6 3.5 - 5.1 mmol/L  ? Chloride 108 98 - 111 mmol/L  ? CO2 25 22 - 32 mmol/L  ? Glucose, Bld 107 (H) 70 - 99 mg/dL  ?  Comment: Glucose reference range applies only to samples taken after fasting for at least 8 hours.  ? BUN 13 8 - 23 mg/dL  ? Creatinine, Ser 1.11 (H) 0.44 - 1.00 mg/dL  ? Calcium 9.5 8.9 - 10.3 mg/dL  ? Total Protein 6.3 (L) 6.5 - 8.1 g/dL  ? Albumin 3.8 3.5 - 5.0 g/dL  ? AST 18 15 - 41 U/L  ? ALT 16 0 - 44 U/L  ? Alkaline Phosphatase 53 38 - 126 U/L  ? Total Bilirubin 0.7 0.3 - 1.2 mg/dL  ? GFR, Estimated 50 (L) >60 mL/min  ?  Comment: (NOTE) ?Calculated using the CKD-EPI Creatinine Equation (2021) ?  ? Anion gap 8 5 - 15  ?  Comment: Performed at [facility], [address] [place], [place] [address]  ?CBC with Differential     Status: None   ? Collection Time: 11/08/21  9:36 PM  ?Result Value Ref Range  ? WBC 6.0 4.0 - 10.5 K/uL  ? RBC 4.62 3.87 - 5.11 MIL/uL  ? Hemoglobin 13.8 12.0 - 15.0 g/dL  ? HCT 40.9 36.0 - 46.0 %  ? MCV 88.5 80.0 - 100.0 fL  ? MCH 29.9 26.0 - 34.0 pg  ? MCHC 33.7 30.0 - 36.0 g/dL  ? RDW 13.3 11.5 - 15.5 %  ? Platelets 192 150 - 400 K/uL  ? nRBC 0.0 0.0 - 0.2 %  ? Neutrophils Relative % 55 %  ? Neutro Abs 3.3 1.7 - 7.7 K/uL  ? Lymphocytes Relative 33 %  ? Lymphs Abs 2.0 0.7 - 4.0 K/uL  ? Monocytes Relative 8 %  ? Monocytes Absolute 0.5 0.1 - 1.0 K/uL  ? Eosinophils Relative 2 %  ? Eosinophils Absolute 0.1 0.0 - 0.5 K/uL  ? Basophils Relative 1 %  ? Basophils Absolute 0.0 0.0 - 0.1 K/uL  ? Immature Granulocytes 1 %  ? Abs Immature Granulocytes 0.04 0.00 - 0.07 K/uL  ?  Comment: Performed at [facility], [address], [place], [place] [address]  ?Ethanol     Status: None  ? Collection Time: 11/08/21  9:36 PM  ?Result Value Ref Range  ? Alcohol, Ethyl (B) <10 <10 mg/dL  ?  Comment: (NOTE) ?Lowest detectable limit for serum alcohol is 10 mg/dL. ? ?For medical purposes only. ?Performed at [facility], [address] [place], [place] ?[address] ?  ?Ammonia     Status: None  ? Collection Time: 11/08/21  9:36 PM  ?Result Value Ref Range  ? Ammonia 34 9 - 35 umol/L  ?  Comment: Performed at [facility], [address] [place], [place] [address]  ?Troponin I (High Sensitivity)     Status: None  ? Collection Time: 11/08/21  9:36 PM  ?Result Value Ref Range  ? Troponin I (High Sensitivity) 8 <18 ng/L  ?  Comment: (NOTE) ?Elevated high sensitivity troponin I (hsTnI) values and significant  ?changes across serial measurements may suggest ACS but many other  ?chronic and acute conditions are known to elevate hsTnI results.  ?Refer to the "Links" section for chest pain algorithms and additional  ?guidance. ?Performed at [facility], [address], [place], [place] ?[address] ?  ?Urinalysis, Routine w reflex  microscopic Urine, Clean Catch     Status: Abnormal  ? Collection Time: 11/08/21 11:05 PM  ?Result Value Ref Range  ? Color, Urine STRAW (A) YELLOW  ? APPearance CLEAR CLEAR  ?  Specific Gravity, Urine 1.009 1.005 - 1.030  ? pH 7.0 5.0 - 8.0  ? Glucose, UA NEGATIVE NEGATIVE mg/dL  ? Hgb urine dipstick NEGATIVE NEGATIVE  ? Bilirubin Urine NEGATIVE NEGATIVE  ? Ketones, ur NEGATIVE NEGATIVE mg/dL  ? Protein, ur NEGATIVE NEGATIVE mg/dL  ? Nitrite NEGATIVE NEGATIVE  ? Leukocytes,Ua NEGATIVE NEGATIVE  ?  Comment: Performed at [facility], [address], [place], [place] [address]  ?Urine Culture     Status: Abnormal  ? Collection Time: 11/08/21 11:05 PM  ? Specimen: Urine, Clean Catch  ?Result Value Ref Range  ? Specimen Description URINE, CLEAN CATCH   ? Special Requests    ?  NONE ?Performed at [facility], [address], [place], [place] [address] ?  ? Culture MULTIPLE SPECIES PRESENT, SUGGEST RECOLLECTION (A)   ? Report Status 11/10/2021 FINAL   ?Troponin I (High Sensitivity)     Status: None  ? Collection Time: 11/09/21  1:05 AM  ?Result Value Ref Range  ? Troponin I (High Sensitivity) 8 <18 ng/L  ?  Comment: (NOTE) ?Elevated high sensitivity troponin I (hsTnI) values and significant  ?changes across serial measurements may suggest ACS but many other  ?chronic and acute conditions are known to elevate hsTnI results.  ?Refer to the "Links" section for chest pain algorithms and additional  ?guidance. ?Performed at [facility], [address] [place], [place] ?[address] ?  ?Lipid panel     Status: Abnormal  ? Collection Time: 11/09/21  5:10 AM  ?Result Value Ref Range  ? Cholesterol 207 (H) 0 - 200 mg/dL  ? Triglycerides 71 <150 mg/dL  ? HDL 52 >40 mg/dL  ? Total CHOL/HDL Ratio 4.0 RATIO  ? VLDL 14 0 - 40 mg/dL  ? LDL Cholesterol 141 (H) 0 - 99 mg/dL  ?  Comment:        ?Total Cholesterol/HDL:CHD Risk ?Coronary Heart Disease Risk Table ?                    Men   Women ? 1/2 Average Risk   3.4    3.3 ? Average Risk       5.0   4.4 ? 2 X Average Risk   9.6   7.1 ? 3 X Average Risk  23.4   11.0 ?       ?Use the calculated Patient Ratio ?above and the CHD Risk Table ?to determine the patient's CHD Risk.

## 2021-11-10 NOTE — Progress Notes (Signed)
Occupational Therapy Treatment ?Patient Details ?Name: Janice [name] ?MRN: [id number] ?DOB: 24-Sep-1940 ?Today's Date: 11/10/2021 ? ? ?History of present illness Janice [name] is a 81 y.o. 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 Found this admission: Bigeminy and possible CKD. PMH is significant for hypertension, remotely hyperlipidemia and tobacco use. ?  ?OT comments ? Pt's cognitive impairment more prevalent this visit with pt demonstrating difficulty with problem solving and awareness of deficits during ADL training. Daughter is quick to assist, cue and respond for pt, educated in benefits of requiring pt to attempt to verbally respond and allow pt time to problem solve. Pt with fatigue in standing, needing min assist to walk back to bed after completing grooming. Updated d/c recommendation to OPOT.   ? ?Recommendations for follow up therapy are one component of a multi-disciplinary discharge planning process, led by the attending physician.  Recommendations may be updated based on patient status, additional functional criteria and insurance authorization. ?   ?Follow Up Recommendations ? Outpatient OT  ?  ?Assistance Recommended at Discharge Frequent or constant Supervision/Assistance  ?Patient can return home with the following ? A little help with bathing/dressing/bathroom;A lot of help with walking and/or transfers;Help with stairs or ramp for entrance;Assist for transportation;Assistance with cooking/housework;Direct supervision/assist for financial management;Direct supervision/assist for medications management ?  ?Equipment Recommendations ? None recommended by OT  ?  ?Recommendations for Other Services   ? ?  ?Precautions / Restrictions Precautions ?Precautions: Fall  ? ? ?  ? ?Mobility Bed Mobility ?Overal bed mobility: Independent ?  ?  ?  ?  ?  ?  ?  ?  ? ?Transfers ?Overall transfer level: Needs  assistance ?Equipment used: None ?Transfers: Sit to/from Stand ?Sit to Stand: Min guard ?  ?  ?  ?  ?  ?  ?  ?  ?Balance Overall balance assessment: Needs assistance ?Sitting-balance support: No upper extremity supported, Feet supported ?Sitting balance-Leahy Scale: Good ?  ?  ?Standing balance support: No upper extremity supported ?Standing balance-Leahy Scale: Fair ?  ?  ?  ?  ?  ?  ?  ?  ?  ?  ?  ?  ?   ? ?ADL either performed or assessed with clinical judgement  ? ?ADL Overall ADL's : Needs assistance/impaired ?  ?  ?Grooming: Oral care;Standing;Minimal assistance;Brushing hair ?Grooming Details (indicate cue type and reason): decreased thoroughnes with combing hair, increased time with toothbrushing ?  ?  ?  ?  ?  ?  ?  ?  ?  ?  ?  ?  ?  ?  ?Functional mobility during ADLs: Minimal assistance;Min guard ?General ADL Comments: pt with fatigue in standing at sink, decreased self monitoring ?  ? ?Extremity/Trunk Assessment   ?  ?  ?  ?  ?  ? ?Vision   ?  ?  ?Perception   ?  ?Praxis   ?  ? ?Cognition Arousal/Alertness: Awake/alert ?Behavior During Therapy: Flat affect ?Overall Cognitive Status: Impaired/Different from baseline ?Area of Impairment: Following commands, Problem solving, Safety/judgement ?  ?  ?  ?  ?  ?  ?  ?  ?  ?  ?  ?Following Commands: Follows one step commands with increased time ?Safety/Judgement: Decreased awareness of safety, Decreased awareness of deficits ?  ?Problem Solving: Slow processing, Requires verbal cues ?General Comments: decreased problem solving with returning cap to toothpaste ?  ?  ?   ?Exercises   ? ?  ?  Shoulder Instructions   ? ? ?  ?General Comments    ? ? ?Pertinent Vitals/ Pain       Pain Assessment ?Pain Assessment: Faces ?Faces Pain Scale: No hurt ? ?Home Living Family/patient expects to be discharged to:: Private residence ?Living Arrangements: Alone (Daughter will stay when discharged.) ?  ?  ?  ?  ?  ?  ?  ?  ?  ?  ?  ?  ?  ?  ?  ?  ?  ? ?  ?Prior  Functioning/Environment    ?  ?  ?  ?   ? ?Frequency ? Min 2X/week  ? ? ? ? ?  ?Progress Toward Goals ? ?OT Goals(current goals can now be found in the care plan section) ? Progress towards OT goals: Progressing toward goals ? ?Acute Rehab OT Goals ?OT Goal Formulation: With patient/family ?Time For Goal Achievement: 11/23/21 ?Potential to Achieve Goals: Good  ?Plan Discharge plan needs to be updated   ? ?Co-evaluation ? ? ?   ?  ?  ?  ?  ? ?  ?AM-PAC OT "6 Clicks" Daily Activity     ?Outcome Measure ? ? Help from another person eating meals?: None ?Help from another person taking care of personal grooming?: A Little ?Help from another person toileting, which includes using toliet, bedpan, or urinal?: A Little ?Help from another person bathing (including washing, rinsing, drying)?: A Little ?Help from another person to put on and taking off regular upper body clothing?: A Little ?Help from another person to put on and taking off regular lower body clothing?: A Little ?6 Click Score: 19 ? ?  ?End of Session Equipment Utilized During Treatment: Gait belt ? ?OT Visit Diagnosis: Unsteadiness on feet (R26.81);Other abnormalities of gait and mobility (R26.89);Other symptoms and signs involving cognitive function ?  ?Activity Tolerance Patient tolerated treatment well ?  ?Patient Left in bed;with call bell/phone within reach;with family/visitor present;with bed alarm set ?  ?Nurse Communication   ?  ? ?   ? ?Time: [id number] ?OT Time Calculation (min): 18 min ? ?Charges: OT General Charges ?$OT Visit: 1 Visit ?OT Treatments ?$Self Care/Home Management : 8-22 mins ? ?Nestor Lewandowsky, OTR/L ?Acute Rehabilitation Services ?Pager: [phone] ?Office: [phone]  ?[name] ?11/10/2021, 1:00 PM ?

## 2021-11-10 NOTE — Progress Notes (Signed)
Patient ID: Janice [name], female   DOB: [birth year], 81 y.o.   MRN: [id number] ?Patient enrolled for [employer] to ship a 30 day Cardiac Event Monitor to the patient address on file. ?Letter with instructions mailed to patient. ?

## 2021-11-11 ENCOUNTER — Other Ambulatory Visit: Payer: Self-pay | Admitting: Family Medicine

## 2021-11-11 ENCOUNTER — Inpatient Hospital Stay (HOSPITAL_COMMUNITY): Payer: Medicare Other

## 2021-11-11 ENCOUNTER — Other Ambulatory Visit (HOSPITAL_COMMUNITY): Payer: Self-pay

## 2021-11-11 DIAGNOSIS — I4891 Unspecified atrial fibrillation: Secondary | ICD-10-CM

## 2021-11-11 DIAGNOSIS — I639 Cerebral infarction, unspecified: Secondary | ICD-10-CM | POA: Diagnosis not present

## 2021-11-11 LAB — GLUCOSE, CAPILLARY: Glucose-Capillary: 116 mg/dL — ABNORMAL HIGH (ref 70–99)

## 2021-11-11 MED ORDER — CLOPIDOGREL BISULFATE 75 MG PO TABS
75.0000 mg | ORAL_TABLET | Freq: Every day | ORAL | 0 refills | Status: AC
  Filled 2021-11-11: qty 90, 90d supply, fill #0

## 2021-11-11 MED ORDER — DOXAZOSIN MESYLATE 2 MG PO TABS
2.0000 mg | ORAL_TABLET | Freq: Every day | ORAL | Status: DC
  Administered 2021-11-11: 2 mg via ORAL
  Filled 2021-11-11 (×2): qty 1

## 2021-11-11 MED ORDER — ASPIRIN 81 MG PO TBEC
81.0000 mg | DELAYED_RELEASE_TABLET | Freq: Every day | ORAL | 3 refills | Status: AC
  Filled 2021-11-11: qty 90, 90d supply, fill #0

## 2021-11-11 MED ORDER — SODIUM CHLORIDE 0.9 % IV BOLUS
1000.0000 mL | Freq: Once | INTRAVENOUS | Status: AC
  Administered 2021-11-11: 1000 mL via INTRAVENOUS

## 2021-11-11 MED ORDER — ATORVASTATIN CALCIUM 80 MG PO TABS
80.0000 mg | ORAL_TABLET | Freq: Every day | ORAL | 0 refills | Status: AC
Start: 2021-11-11 — End: ?
  Filled 2021-11-11: qty 30, 30d supply, fill #0

## 2021-11-11 MED ORDER — SODIUM CHLORIDE 0.9 % IV BOLUS: 1000.0000 mL | Freq: Once | INTRAVENOUS | Status: DC

## 2021-11-11 NOTE — Progress Notes (Signed)
Family Medicine Teaching Service ?Daily Progress Note ?Intern Pager: [phone] ? ?Patient name: Janice [name] Medical record number: [id number] ?Date of birth: 08-Feb-1941 Age: 81 y.o. Gender: female ? ?Primary Care Provider: Neale Burly, MD ?Consultants: Neurology s/o ?Code Status: Full ? ?Pt Overview and Major Events to Date:  ?5/9 - Admitted ? ?Assessment and Plan: ? ?Janice [name] is a 81 y.o. female presenting with aphasia and dysarthria. 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 ?  ?Basal Ganglia Stroke ?Permissive HTN (BP < 220/120) for next 3-5 days. BP in range overnight. Patient to d/c home ASA & plavix for 3 months, then asa alone. Outpatient PT/OT/SLP recommended. ?-Neurology following, appreciate recs ?-Continuous cardiac monitoring ?-ASA 81, Plavix 75 ?-LDL goal < 70 ?-Q4H Neuro checks ?-Up with assistance ?-PT/OT/SLP ? ? ? ?HTN ?BP stable overnight, f/u outpatient ?-Losartan 100 mg daily ?-Doxazosin 4 mg qhs  ? ?CKD 3a ?stable ? ?HLD ?-atorvastatin 80 mg ? ?FEN/GI: Heart Healthy ?PPx: Lovenox ?Dispo:Home today. .  ? ?Subjective:  ?Patient doing well today, wants to go home ? ?Objective: ?Temp:  [97.9 ?F (36.6 ?C)-98.8 ?F (37.1 ?C)] 98.3 ?F (36.8 ?C) (05/11 0401) ?Pulse Rate:  [56-72] 56 (05/11 0401) ?Resp:  [15-20] 15 (05/11 0401) ?BP: (126-172)/(61-86) 160/63 (05/11 0401) ?SpO2:  [95 %-100 %] 97 % (05/11 0401) ?Physical Exam: ?General: Well appearing, NAD, frail, elderly, white woman ?Cardiovascular: RRR, NRMG ?Respiratory: CTABL ?Abdomen: Soft, nttp, non-distended ?Extremities: No edema, cap refill < 2 sec ? ?Laboratory: ?Recent Labs  ?Lab 11/08/21 ?2136  ?WBC 6.0  ?HGB 13.8  ?HCT 40.9  ?PLT 192  ? ?Recent Labs  ?Lab 11/08/21 ?2136 11/09/21 ?0510 11/10/21 ?0342  ?NA 141 142 139  ?K 3.6 3.7 3.5  ?CL 108 110 108  ?CO2 25 26 25   ?BUN 13 11 12   ?CREATININE 1.11* 1.07* 1.00  ?CALCIUM 9.5 9.0 9.1  ?PROT 6.3* 5.9*  --   ?BILITOT 0.7 0.7  --    ?ALKPHOS 53 52  --   ?ALT 16 16  --   ?AST 18 18  --   ?GLUCOSE 107* 99 106*  ? ? ? ? ?Imaging/Diagnostic Tests: ? ? ?Holley Bouche, MD ?11/11/2021, 5:26 AM ?PGY-1, [facility] ?[place] Intern pager: [phone], text pages welcome ? ?

## 2021-11-11 NOTE — Discharge Instructions (Addendum)
Dear Park Liter, ? ?Thank you for letting us participate in your care. You were hospitalized for a stroke and diagnosed with Acute stroke of basal ganglia ([place]). You were treated with Asprin and Plavix.  ? ?POST-HOSPITAL & CARE INSTRUCTIONS ?Take your Asprin & Plavix for next 3 months. Then continue Asprin alone ?Take your statin medication for cholesterol control ?Place ted hose or compression stockings on your calves every day in the morning. ?We reduced your nightly doxazosin from 4 mg to 2 mg nightly. We believe this will help with your orthostatic hypotension (dizziness and low blood pressure when you stand up). ?Go to your follow up appointments (listed below) ?Seek immediate medical care if ?Having issues with memory, speech, acute weakness/loss of motor skills, loss of sensation, loss/changes in vision ? ? ?DOCTOR'S APPOINTMENT   ?Future Appointments  ?Date Time Provider [place]  ?11/25/2021  9:00 AM Ephraim Hamburger, CCC-SLP [facility] None  ?12/17/2021  1:45 PM Troxler, Lujean Amel, OT [facility] None  ?12/17/2021  2:45 PM Leeroy Cha, PT [facility] None  ?12/23/2021  8:20 AM Buford Dresser, MD [facility]  ? ? Follow-up Information   ? ? [facility] Follow up.   ?Specialty: Rehabilitation ?Why: The outpatient rehab will contact you for the first appointment. ?Contact information: ?[address] ?[address] ?[id number] mc ?[place] ?[phone] ? ?  ?  ? ? Buford Dresser, MD Follow up.   ?Specialty: Cardiology ?Why: Cardiology office will mail you a 30 day monitor to wear to evaluate for any abnormal heart rhythms. This will come with instructions for use but please contact the monitor company listed on the box or our office number above if you have any questions. You will have a cardiology follow-up visit to review the monitor results at our [facility] at [facility] location on Thursday Dec 23, 2021 at 8:20 AM.  Please arrive 15 minutes early to check in. ?Contact information: ?[id number] ?[place] [address] ?[phone] ? ? ?  ?  ? ? Neale Burly, MD. Schedule an appointment as soon as possible for a visit in 1 week(s).   ?Specialty: Internal Medicine ?Why: Make a follow up appointment with your PCP ?Contact information: ?[place] ?[place] [address] ?[phone] ? ? ?  ?  ? ?  ?  ? ?  ? ? ?Take care and be well! ? ?Family Medicine Teaching Service Inpatient Team ?[place]  ?[facility]  ?[address] [place], [place] [address] ?[phone] ? ? ? ?

## 2021-11-11 NOTE — Discharge Summary (Signed)
Family Medicine Teaching Service ?Hospital Discharge Summary ? ?Patient name: Janice [name] Medical record number: [id number] ?Date of birth: 12/10/1940 Age: 81 y.o. Gender: female ?Date of Admission: 11/08/2021  Date of Discharge: 11/11/21 ?Admitting Physician: Darral Dash, DO ? ?Primary Care Provider: Toma Deiters, MD ?Consultants: Neurology ? ?Indication for Hospitalization: Stroke ? ?Discharge Diagnoses/Problem List:  ?Basal Ganglia Stroke ?HTN ?HLD ?Bigeminy ? ?Disposition: Home ? ?Discharge Condition: Improved ? ?Discharge Exam:  ?Temp:  [97.9 ?F (36.6 ?C)-98.8 ?F (37.1 ?C)] 98.3 ?F (36.8 ?C) (05/11 0401) ?Pulse Rate:  [56-72] 56 (05/11 0401) ?Resp:  [15-20] 15 (05/11 0401) ?BP: (126-172)/(61-86) 160/63 (05/11 0401) ?SpO2:  [95 %-100 %] 97 % (05/11 0401) ?Physical Exam: ?General: Well appearing, NAD, frail, elderly, white woman ?Cardiovascular: RRR, NRMG ?Respiratory: CTABL ?Abdomen: Soft, nttp, non-distended ?Extremities: No edema, cap refill < 2 sec ? ?Brief Hospital Course:  ?[name] is a 81 y.o. female presenting with aphasia and dysarthria. PMH is significant for hypertension, remotely hyperlipidemia and tobacco use. ?  ?CVA  L. Basal Ganglia Stroke ?Patient presented with aphasia and dysarthria; after prolonged period of symptom on-start, (went to sleep after symptom onset, and went to ED following morning).  CT head showed no acute intracranial abnormalities.  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 CBC and BMP in ED within normal limits. Neurology was consulted and patient was started on ASA 81 and Plavix 75. She was to continue both for 3 months and then ASA alone. PT/OT saw and evaluated patient and felt she was benefit from continued outpatient PT/OT. Patient had no FND on discharge. ? ?HLD ?Patient had Total Chol 207, and LDL 141, with history of high intensity statin, appears as though compliance has been an issue. Patient was encouraged to  take statin at d/c ? ?Bigeminy ?EKG shows sinus rhythm with bigeminy on admission, with HR ranging from 60-70.  Does not report history of any cardiac problems.  She remained stable throughout admission. Patient had Zio Patch sent to home.  ? ? ?Discharge follow up considerations: ?PCP to refer patient for outpatient PT/OT and speech therapies.  ?Lipid panel high on admission. Total 207, LDL 141.Started atorvastatin 80 mg daily prior to discharge, tolerated well. PCP to recheck LDL in 3 months.  ?Patient likely not taking statin, encourage in outpatient setting ?TSH on admission 4.766, demonstrates subclinical hypothyroidism, cannot diagnose in acute period. PCP to follow and repeat TSH in 6 weeks.  ?Follow up with cardiology for HFpEF on echo in outpatient ?Patient had Zio patch sent to home, will need follow up for placement and return.  ?Unsure why pt is on high dose doxazosin, would consider stopping and starting more traditional anti-hypertensive  ?Held metoprolol, consider restarting outpatient ? ? ?Significant Procedures: None ? ?Significant Labs and Imaging:  ?Recent Labs  ?Lab 11/08/21 ?2136  ?WBC 6.0  ?HGB 13.8  ?HCT 40.9  ?PLT 192  ? ?Recent Labs  ?Lab 11/08/21 ?2136 11/09/21 ?0510 11/10/21 ?0342  ?NA 141 142 139  ?K 3.6 3.7 3.5  ?CL 108 110 108  ?CO2 25 26 25   ?GLUCOSE 107* 99 106*  ?BUN 13 11 12   ?CREATININE 1.11* 1.07* 1.00  ?CALCIUM 9.5 9.0 9.1  ?ALKPHOS 53 52  --   ?AST 18 18  --   ?ALT 16 16  --   ?ALBUMIN 3.8 3.6  --   ? ? ? ? ?Results/Tests Pending at Time of Discharge: None ? ?Discharge Medications:  ?Allergies as of  11/11/2021   ? ?   Reactions  ? Norvasc [amlodipine] Swelling  ? ?  ? ?  ?Medication List  ?  ? ?STOP taking these medications   ? ?metoprolol succinate 100 MG 24 hr tablet ?Commonly known as: TOPROL-XL ?  ? ?  ? ?TAKE these medications   ? ?Aspirin Low Dose 81 MG EC tablet ?Generic drug: aspirin ?Take 1 tablet (81 mg total) by mouth daily. Swallow whole. ?  ?atorvastatin 80 MG  tablet ?Commonly known as: LIPITOR ?Take 1 tablet (80 mg total) by mouth daily. ?  ?clopidogrel 75 MG tablet ?Commonly known as: PLAVIX ?Take 1 tablet (75 mg total) by mouth daily. ?  ?doxazosin 4 MG tablet ?Commonly known as: CARDURA ?Take 4 mg by mouth at bedtime. ?  ?losartan 100 MG tablet ?Commonly known as: COZAAR ?Take 100 mg by mouth daily. ?  ? ?  ? ? ?Discharge Instructions: Please refer to Patient Instructions section of EMR for full details.  Patient was counseled important signs and symptoms that should prompt return to medical care, changes in medications, dietary instructions, activity restrictions, and follow up appointments.  ? ?Follow-Up Appointments: ? Follow-up Information   ? ? [facility] Follow up.   ?Specialty: Rehabilitation ?Why: The outpatient rehab will contact you for the first appointment. ?Contact information: ?[address] ?[address] ?[id number] mc ?[place] [address] ?[phone] ? ?  ?  ? ? Janice Red, MD Follow up.   ?Specialty: Cardiology ?Why: Cardiology office will mail you a 30 day monitor to wear to evaluate for any abnormal heart rhythms. This will come with instructions for use but please contact the monitor company listed on the box or our office number above if you have any questions. You will have a cardiology follow-up visit to review the monitor results at our [facility] at [facility] location on Thursday Dec 23, 2021 at 8:20 AM. Please arrive 15 minutes early to check in. ?Contact information: ?[address] ?[place] [address] ?[phone] ? ? ?  ?  ? ? Toma Deiters, MD. Schedule an appointment as soon as possible for a visit in 1 week(s).   ?Specialty: Internal Medicine ?Why: Make a follow up appointment with your PCP ?Contact information: ?[address] ?[place] [address] ?[phone] ? ? ?  ?  ? ?  ?  ? ?  ? ? ?Bess Kinds, MD ?11/11/2021, 12:48 PM ?PGY-1, [facility] ? ?

## 2021-11-11 NOTE — Plan of Care (Signed)
?  Problem: Education: ?Goal: Knowledge of disease or condition will improve ?Outcome: Progressing ?Goal: Knowledge of secondary prevention will improve (SELECT ALL) ?Outcome: Progressing ?Goal: Knowledge of patient specific risk factors will improve (INDIVIDUALIZE FOR PATIENT) ?Outcome: Progressing ?Goal: Individualized Educational Video(s) ?Outcome: Progressing ?  ?Problem: Health Behavior/Discharge Planning: ?Goal: Ability to manage health-related needs will improve ?Outcome: Progressing ?  ?Problem: Self-Care: ?Goal: Ability to communicate needs accurately will improve ?Outcome: Progressing ?  ?Problem: Ischemic Stroke/TIA Tissue Perfusion: ?Goal: Complications of ischemic stroke/TIA will be minimized ?Outcome: Progressing ?  ?

## 2021-11-11 NOTE — Progress Notes (Signed)
FPTS Brief Progress Note ? ?S:Patient asleep in bed ? ? ?O: ?BP (!) 161/69 (BP Location: Left Arm)   Pulse 70   Temp 98.6 ?F (37 ?C) (Oral)   Resp 16   Ht 5\' 4"  (1.626 m)   Wt 62.1 kg   SpO2 95%   BMI 23.50 kg/m?   ? ?Gen: Asleep laying in bed ?Resp: no distress ? ?A/P: ?BP wnl ?- Orders reviewed. Labs for AM not ordered, which was adjusted as needed.  ? ? ? , MD ?11/11/2021, 3:43 AM ?PGY-1, [place] [facility] Night Resident  ?Please page [phone] with questions.  ?  ?

## 2021-11-11 NOTE — Progress Notes (Signed)
STROKE TEAM PROGRESS NOTE  ? ?INTERVAL HISTORY ?Stroke team was called to evaluate Janice [name] again as she had logical worsening.  [name] had worked with physical therapy earlier and as she stood up standing to go home she developed new slurred speech and worsening right facial droop and right leg weakness.  [name] symptoms improved slightly after she lay flat.  By Janice time I evaluated her she had mild slurred speech and nasal labial fold asymmetry in Janice right extremity strength had improved.  She was earlier reported to be orthostatic.  I recommend [name] get 1 L normal saline bolus and check CT head but observe [name] to mild amount of discharge. ?Vitals:  ? 11/10/21 2336 11/11/21 0401 11/11/21 0817 11/11/21 1122  ?BP: (!) 161/69 (!) 160/63 (!) 161/76 (!) 159/63  ?Pulse: 70 (!) 56 (!) 57 67  ?Resp: 16 15 16 16   ?Temp: 98.6 ?F (37 ?C) 98.3 ?F (36.8 ?C) 98 ?F (36.7 ?C) 98 ?F (36.7 ?C)  ?TempSrc: Oral Oral Oral Oral  ?SpO2: 95% 97% 96% 97%  ?Weight:      ?Height:      ? ?CBC:  ?Recent Labs  ?Lab 11/08/21 ?2136  ?WBC 6.0  ?NEUTROABS 3.3  ?HGB 13.8  ?HCT 40.9  ?MCV 88.5  ?PLT 192  ? ?Basic Metabolic Panel:  ?Recent Labs  ?Lab 11/09/21 ?0510 11/10/21 ?0342  ?NA 142 139  ?K 3.7 3.5  ?CL 110 108  ?CO2 26 25  ?GLUCOSE 99 106*  ?BUN 11 12  ?CREATININE 1.07* 1.00  ?CALCIUM 9.0 9.1  ? ?Lipid Panel:  ?Recent Labs  ?Lab 11/09/21 ?0510  ?CHOL 207*  ?TRIG 71  ?HDL 52  ?CHOLHDL 4.0  ?VLDL 14  ?[name] 141*  ? ?HgbA1c:  ?Recent Labs  ?Lab 11/09/21 ?0510  ?HGBA1C 5.2  ? ?Urine Drug Screen: No results for input(s): LABOPIA, COCAINSCRNUR, LABBENZ, AMPHETMU, THCU, LABBARB in Janice last 168 hours.  ?Alcohol Level  ?Recent Labs  ?Lab 11/08/21 ?2136  ?ETH <10  ? ? ?IMAGING past 24 hours ?No results found. ? ?PHYSICAL EXAM ?General:  Alert, well-nourished, well-developed pleasant Caucasian elderly [name] in no acute distress ?Respiratory:  Regular, unlabored respirations on room air ? ?NEURO:  ?Mental Status: AA&Ox3   ?Speech/Language: speech is hesitant slightly nonfluent with mild dysarthria and expressive aphasia.  Naming and repetition are intact today, and speech is nonfluent. ? ?Cranial Nerves:  ?II: PERRL. Visual fields full. ?III, IV, VI: EOMI. Eyelids elevate symmetrically.  ?V: Sensation is intact to light touch and symmetrical to face.  ?VII: Smile is symmetrical.  ?VIII: hearing intact to voice. ?IX, X: Phonation is normal.  ?XII: tongue is midline without fasciculations. ?Motor: 5/5 strength to all muscle groups tested.  ?Sensation- Intact to light touch bilaterally.  ?Coordination: FTN intact bilaterally.  No drift.  ?Gait- deferred ? ? NIHSS 2 premorbid modified Rankin score 0 ? ?ASSESSMENT/PLAN ?Janice [name] is a 81 y.o. female with history of anemia and HTN presenting with sudden onset aphasia.  She presented to Janice ED Janice  day aftr her symptoms appeared, because symptoms did not resolve.  [name] states that her speech is more normal now but not yet back to baseline.  MRI shows ischemic stroke in Janice left basal ganglia.  She denies a history of atrial fibrillation. ? 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 Small vessel disease. Atrophy.  ?CTA head & neck stenosis  in left M1, right P2 and left P3 ?MRI  patchy acute left basal ganglia infarct, chronic microvascular disease ?2D Echo EF 55 to 60%.  Grade 2 diastolic dysfunction. ?LDL 141 ?HgbA1c 5.2 ?VTE prophylaxis - lovenox ?   ?Diet  ? Diet Heart Room service appropriate? Yes; Fluid consistency: Thin  ? ?No antithrombotic prior to admission, now on aspirin 81 mg daily and clopidogrel 75 mg daily.  ?Will plan for loop recorder at discharge as stroke appears embolic ?Therapy recommendations:  no OT follow up, PT pending ?Disposition:  pending, likely home ? ?Hypertension ?Home meds:  doxazosin 4 mg daily, lisinopril-HCTZ 20-12.5 daily ?Stable ?Permissive hypertension  (OK if < 220/120) but gradually normalize in 5-7 days ?Long-term BP goal normotensive ? ?Hyperlipidemia ?Home meds:  simvastatin 40 mg daily, changed to atorvastatin 80 mg daily ?LDL 141, goal < 70 ?Continue statin at discharge ? ? ?Other Stroke Risk Factors ?Advanced Age >/= 65  ? ?Other Active Problems ?none ?[name] presented with mild aphasia due to large patchy left basal ganglia infarct and was doing well but has had neurological worsening this morning.  This is likely due to orthostasis from standing.  Recommend bedrest with IV normal saline bolus and check CT scan of Janice head.  Hold discharge for today.  [name] advised orthostatic tolerance exercises.  Family medicine team to review blood pressure medications and adjust if needed.  Discussed with Dr. Augustin Coupe family practice.  Discussed with [name] multiple family members at Janice bedside and answered questions.  Greater than 50% time during this 35-minute visit was spent in counseling and coordination of care with [name] and care team ?Questions. ? ?Antony Contras, MD ?Medical Director ?[facility] ?Pager: [phone] ?11/11/2021 3:31 PM ? ? ?To contact Stroke Continuity provider, please refer to [email or web address] ?After hours, contact General Neurology  ?

## 2021-11-11 NOTE — Significant Event (Addendum)
RN paged, patient dizzy upon standing to go home and DC. Positive orthostatics. New slurred speech, worsened right facial droop, worsened RLE weakness. FM residents Dr. Melissa Noon and Larita Fife at bedside, confirm physical findings.  ? ?Attending Dr. Deirdre Priest notified.  ?STAT CT head wo ordered ?STAT glucose ordered ?Stroke team on call MD Dr. Pearlean Brownie notified ? ?Fayette Pho, MD ? ?ADDENDUM ? ?Blood pressure (!) 159/63, pulse 67, temperature 98 ?F (36.7 ?C), temperature source Oral, resp. rate 16, height 5\' 4"  (1.626 m), weight 62.1 kg, SpO2 97 %.  ? ?Neuro Dr. at bedside believes this to be due to orthostasis and highly unlikely to be repeat stroke.  ?CBG normal.  ?1000 mL NS bolus over 2 hours, EF 55-60% 11/09/21 ?No DC today, will observe overnight ? ?01/09/22, MD ? ? ?

## 2021-11-11 NOTE — Progress Notes (Signed)
Physical Therapy Treatment ?Patient Details ?Name: Janice [name] ?MRN: [id number] ?DOB: [birth year] ?Today's Date: 11/11/2021 ? ? ?History of Present Illness Janice [name] is a 81 y.o. 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 Found this admission: Bigeminy and possible CKD. PMH is significant for hypertension, remotely hyperlipidemia and tobacco use. ? ?  ?PT Comments  ? ? Pt was seen for progression of gait to the stairs and used SPC in her room.  Pt was inattentive to the device so was not deemed that helpful in that use.  HHPT is planned to see her at home, and will anticipate her need to be reassessed there for AD if appropriate.  Pt is going to be supervised with family, and discussed with them her ongoing issues of obstacle clearance.  Follow along with her for balance and gait quality, and will continue daily therapy until she is home.   ?Recommendations for follow up therapy are one component of a multi-disciplinary discharge planning process, led by the attending physician.  Recommendations may be updated based on patient status, additional functional criteria and insurance authorization. ? ?Follow Up Recommendations ? Outpatient PT ?  ?  ?Assistance Recommended at Discharge Frequent or constant Supervision/Assistance  ?Patient can return home with the following A little help with bathing/dressing/bathroom;Assistance with cooking/housework;Direct supervision/assist for medications management;Direct supervision/assist for financial management;Assist for transportation;Help with stairs or ramp for entrance;A little help with walking and/or transfers ?  ?Equipment Recommendations ? None recommended by PT  ?  ?Recommendations for Other Services   ? ? ?  ?Precautions / Restrictions Precautions ?Precautions: Fall ?Precaution Comments: family in attendance to discuss her balance concerns ?Restrictions ?Weight Bearing  Restrictions: Yes  ?  ? ?Mobility ? Bed Mobility ?Overal bed mobility: Modified Independent ?  ?  ?  ?  ?  ?  ?  ?  ? ?Transfers ?Overall transfer level: Needs assistance ?Equipment used: 1 person hand held assist ?Transfers: Sit to/from Stand ?Sit to Stand: Min guard ?  ?  ?  ?  ?  ?General transfer comment: Min guard for safety ?  ? ?Ambulation/Gait ?Ambulation/Gait assistance: Min guard ?Gait Distance (Feet): 75 Feet ?Assistive device: 1 person hand held assist, Straight cane ?Gait Pattern/deviations: Step-through pattern, Decreased stride length, Wide base of support ?Gait velocity: Decreased ?Gait velocity interpretation: <1.31 ft/sec, indicative of household ambulator ?  ?General Gait Details: Pt requires cues to avoid obstacles and sources of trips, very unaware and family were able to observe her issues ? ? ?Stairs ?Stairs: Yes ?Stairs assistance: Min guard ?Stair Management: Two rails, Step to pattern ?Number of Stairs: 10 ?General stair comments: pt was safe with min guard and railing, PT encouraged her to take her time ? ? ?Wheelchair Mobility ?  ? ?Modified Rankin (Stroke Patients Only) ?  ? ? ?  ?Balance Overall balance assessment: Needs assistance ?Sitting-balance support: Feet supported ?Sitting balance-Leahy Scale: Good ?  ?  ?Standing balance support: Single extremity supported, During functional activity ?Standing balance-Leahy Scale: Fair ?Standing balance comment: less than fair dynamically ?  ?  ?  ?  ?  ?  ?  ?  ?  ?  ?  ?  ? ?  ?Cognition Arousal/Alertness: Awake/alert ?Behavior During Therapy: Flat affect ?Overall Cognitive Status: Impaired/Different from baseline ?Area of Impairment: Problem solving, Awareness, Safety/judgement, Following commands ?  ?  ?  ?  ?  ?  ?  ?  ?  ?  ?  ?  Following Commands: Follows one step commands with increased time ?Safety/Judgement: Decreased awareness of deficits ?Awareness: Intellectual ?Problem Solving: Slow processing, Requires verbal cues ?General  Comments: needs extra verbal cues for safety with SPC ?  ?  ? ?  ?Exercises   ? ?  ?General Comments General comments (skin integrity, edema, etc.): pt wa sunsafe to walk alone but with [facility] she let the device lag behind, very inattentive to it ?  ?  ? ?Pertinent Vitals/Pain Pain Assessment ?Pain Assessment: No/denies pain  ? ? ?Home Living   ?  ?  ?  ?  ?  ?  ?  ?  ?  ?   ?  ?Prior Function    ?  ?  ?   ? ?PT Goals (current goals can now be found in the care plan section) Acute Rehab PT Goals ?Patient Stated Goal: to go home ?Progress towards PT goals: Progressing toward goals ? ?  ?Frequency ? ? ? Min 3X/week ? ? ? ?  ?PT Plan Current plan remains appropriate  ? ? ?Co-evaluation   ?  ?  ?  ?  ? ?  ?AM-PAC PT "6 Clicks" Mobility   ?Outcome Measure ? Help needed turning from your back to your side while in a flat bed without using bedrails?: None ?Help needed moving from lying on your back to sitting on the side of a flat bed without using bedrails?: A Little ?Help needed moving to and from a bed to a chair (including a wheelchair)?: A Little ?Help needed standing up from a chair using your arms (e.g., wheelchair or bedside chair)?: A Little ?Help needed to walk in hospital room?: A Little ?Help needed climbing 3-5 steps with a railing? : A Little ?6 Click Score: 19 ? ?  ?End of Session Equipment Utilized During Treatment: Gait belt ?Activity Tolerance: Patient tolerated treatment well;Patient limited by fatigue ?Patient left: in bed;with call bell/phone within reach;with family/visitor present ?Nurse Communication: Mobility status ?PT Visit Diagnosis: Unsteadiness on feet (R26.81);Muscle weakness (generalized) (M62.81) ?  ? ? ?Time: [id number] ?PT Time Calculation (min) (ACUTE ONLY): 29 min ? ?Charges:  $Gait Training: 8-22 mins ?$Neuromuscular Re-education: 8-22 mins     ?Ramond Dial ?11/11/2021, 9:46 PM ? ?Mee Hives, PT PhD ?Acute Rehab Dept. Number: [facility] [id number] and [facility] [id number] ? ? ? ?

## 2021-11-12 ENCOUNTER — Other Ambulatory Visit (HOSPITAL_COMMUNITY): Payer: Self-pay

## 2021-11-12 DIAGNOSIS — Z23 Encounter for immunization: Secondary | ICD-10-CM | POA: Diagnosis not present

## 2021-11-12 DIAGNOSIS — I639 Cerebral infarction, unspecified: Secondary | ICD-10-CM | POA: Diagnosis not present

## 2021-11-12 MED ORDER — DOXAZOSIN MESYLATE 2 MG PO TABS
2.0000 mg | ORAL_TABLET | Freq: Every day | ORAL | 0 refills | Status: DC
  Filled 2021-11-12: qty 30, 30d supply, fill #0

## 2021-11-12 NOTE — Plan of Care (Signed)
?  Problem: Education: ?Goal: Knowledge of disease or condition will improve ?Outcome: Progressing ?Goal: Knowledge of secondary prevention will improve (SELECT ALL) ?Outcome: Progressing ?Goal: Knowledge of patient specific risk factors will improve (INDIVIDUALIZE FOR PATIENT) ?Outcome: Progressing ?Goal: Individualized Educational Video(s) ?Outcome: Progressing ?  ?Problem: Health Behavior/Discharge Planning: ?Goal: Ability to manage health-related needs will improve ?Outcome: Progressing ?  ?Problem: Self-Care: ?Goal: Ability to communicate needs accurately will improve ?Outcome: Progressing ?  ?Problem: Ischemic Stroke/TIA Tissue Perfusion: ?Goal: Complications of ischemic stroke/TIA will be minimized ?Outcome: Progressing ?  ?

## 2021-11-12 NOTE — Progress Notes (Addendum)
STROKE TEAM PROGRESS NOTE  ? ?INTERVAL HISTORY ?Patient has been hemodynamically stable with no acute events overnight.  Her neurological exam has improved, with only slight right facial droop remaining. ?Vitals:  ? 11/11/21 2346 11/12/21 0346 11/12/21 0804 11/12/21 1207  ?BP: (!) 174/76 (!) 175/76 (!) 148/74 (!) 159/75  ?Pulse: 77 68 70 66  ?Resp: 15 16 16 16   ?Temp: 98.7 ?F (37.1 ?C) 98.9 ?F (37.2 ?C) 98.4 ?F (36.9 ?C) 98.6 ?F (37 ?C)  ?TempSrc: Oral Oral Oral Oral  ?SpO2: 97% 95% 97% 94%  ?Weight:      ?Height:      ? ?CBC:  ?Recent Labs  ?Lab 11/08/21 ?2136  ?WBC 6.0  ?NEUTROABS 3.3  ?HGB 13.8  ?HCT 40.9  ?MCV 88.5  ?PLT 192  ? ? ?Basic Metabolic Panel:  ?Recent Labs  ?Lab 11/09/21 ?0510 11/10/21 ?0342  ?NA 142 139  ?K 3.7 3.5  ?CL 110 108  ?CO2 26 25  ?GLUCOSE 99 106*  ?BUN 11 12  ?CREATININE 1.07* 1.00  ?CALCIUM 9.0 9.1  ? ? ?Lipid Panel:  ?Recent Labs  ?Lab 11/09/21 ?0510  ?CHOL 207*  ?TRIG 71  ?HDL 52  ?CHOLHDL 4.0  ?VLDL 14  ?[place] 141*  ? ? ?HgbA1c:  ?Recent Labs  ?Lab 11/09/21 ?0510  ?HGBA1C 5.2  ? ? ?Urine Drug Screen: No results for input(s): LABOPIA, COCAINSCRNUR, LABBENZ, AMPHETMU, THCU, LABBARB in the last 168 hours.  ?Alcohol Level  ?Recent Labs  ?Lab 11/08/21 ?2136  ?ETH <10  ? ? ? ?IMAGING past 24 hours ?CT HEAD WO CONTRAST (5MM) ? ?Result Date: 11/11/2021 ?CLINICAL DATA:  Neuro deficit, acute, stroke suspected EXAM: CT HEAD WITHOUT CONTRAST TECHNIQUE: Contiguous axial images were obtained from the base of the skull through the vertex without intravenous contrast. RADIATION DOSE REDUCTION: This exam was performed according to the departmental dose-optimization program which includes automated exposure control, adjustment of the mA and/or kV according to patient size and/or use of iterative reconstruction technique. COMPARISON:  CT head Nov 08, 2021.  MRI head Nov 09, 2021. FINDINGS: Brain: Evolving acute left basal ganglia infarct with increased edema. Mild effacement of the left frontal horn  lateral ventricle without midline shift. No acute hemorrhage. No hydrocephalus, mass lesion, or extra-axial fluid collection. Vascular: No hyperdense vessel identified. Skull: No acute fracture. Sinuses/Orbits: Clear sinuses.  No acute orbital findings. Other: No mastoid effusions. IMPRESSION: Evolving acute left basal ganglia infarct with increased edema. Mild effacement of the frontal horn of the left lateral ventricle without midline shift. No acute hemorrhage. Electronically Signed   By: Margaretha Sheffield M.D.   On: 11/11/2021 15:31   ? ?PHYSICAL EXAM ?General:  Alert, well-nourished, well-developed pleasant Caucasian elderly patient in no acute distress ?Respiratory:  Regular, unlabored respirations on room air ? ?NEURO:  ?Mental Status: AA&Ox3  ?Speech/Language: speech is hesitant slightly nonfluent with mild dysarthria.  Speech is more fluent today. ? ?Cranial Nerves:  ?II: PERRL. Visual fields full. ?III, IV, VI: EOMI. Eyelids elevate symmetrically.  ?V: Sensation is intact to light touch and symmetrical to face.  ?VII: Slight right facial droop  ?VIII: hearing intact to voice. ?IX, X: Phonation is normal.  ?XII: tongue is midline without fasciculations. ?Motor: 5/5 strength to all muscle groups tested.  ?Sensation- Intact to light touch bilaterally.  ?Coordination: FTN intact bilaterally.  No drift.  ?Gait- deferred ? ? NIHSS 2 premorbid modified Rankin score 0 ? ?ASSESSMENT/PLAN ?[name] is a 81 y.o. female with history of  anemia and HTN presenting with sudden onset aphasia.  She presented to the ED the  day aftr her symptoms appeared, because symptoms did not resolve.  Patient states that her speech is more normal now but not yet back to baseline.  MRI shows ischemic stroke in the left basal ganglia.  She denies a history of atrial fibrillation. ? 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 Small vessel  disease. Atrophy.  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  Grade 2 diastolic dysfunction. ?LDL 141 ?HgbA1c 5.2 ?VTE prophylaxis - lovenox ?   ?Diet  ? Diet Heart Room service appropriate? Yes; Fluid consistency: Thin  ? ?No antithrombotic prior to admission, now on aspirin 81 mg daily and clopidogrel 75 mg daily.  ?Will plan for loop recorder at discharge as stroke appears embolic ?Therapy recommendations:  no OT follow up, PT pending ?Disposition:  pending, likely home ? ?Hypertension ?Home meds:  doxazosin 4 mg daily, lisinopril-HCTZ 20-12.5 daily ?Stable ?Permissive hypertension (OK if < 220/120) but gradually normalize in 5-7 days ?Long-term BP goal normotensive ? ?Hyperlipidemia ?Home meds:  simvastatin 40 mg daily, changed to atorvastatin 80 mg daily ?LDL 141, goal < 70 ?Continue statin at discharge ? ? ?Other Stroke Risk Factors ?Advanced Age >/= 25  ? ?Other Active Problems ?none ? ?[place] , MSN, AGACNP-BC ?[facility] ?See Amion for schedule and pager information ?11/12/2021 1:23 PM ? [name],examined this patient, reviewed notes, independently viewed imaging studies, participated in medical decision making and plan of care.ROS completed by me personally and pertinent positives fully documented  I have made any additions or clarifications directly to the above note. Agree with note above.  Patient has remained stable overnight and seems to be improving and he has mild right lower facial asymmetry and minimal dysarthria.  Recommend continue aspirin Plavix 3 months followed by aspirin alone.  Consider loop recorder at discharge.  Patient may also consider possible participation in the [place] stroke prevention study if interested.  Long discussion with patient and son and answered questions about her care.  Follow-up with an outpatient  stroke clinic in 2 months.  Greater than 50% time during the 35-minute visit was spent in counseling and coordination of care about her stroke and intracranial stenosis and discussion about stroke prevention and treatment answering questions ? ?Antony Contras, MD ?Medical Director ?[facility] ?Pager: [phone] ?11/12/2021 1:35 PM ? ?To contact Stroke Continuity provider, please refer to [email or web address] ?After hours, contact General Neurology  ?

## 2021-11-12 NOTE — Progress Notes (Addendum)
Family Medicine Teaching Service ?Daily Progress Note ?Intern Pager: [phone] ? ?Patient name: Janice [name] Medical record number: [id number] ?Date of birth: [birth year] Age: 81 y.o. Gender: female ? ?Primary Care Provider: Toma Deiters, MD ?Consultants: Neurology ?Code Status: Full ? ?Pt Overview and Major Events to Date:  ?5/9 - admitted ? ?Assessment and Plan: ? ?Janice [name] is a 81 y.o. female presenting with aphasia and dysarthria. 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 ?  ?Basal Ganglia Stroke ?Patient had event concerning for stroke yesterday with right sided facial droop, slurred speech, and weakness, Neurology consulted, CT Head showed showed evolving acute left basal ganglia infarct with increased edema, not visualized on previous CT head, Neurology noted this finding is normal post stroke finding, and thought event 2/2 orthostasis. Patient with no FND on exam today, feeling well and ambulate in room well. Patient ready for d/c ?-Neurology following appreciate recs ?-Vitlas per unit ?-Q4h Neuro checks ?-Continuos cardiac monitoring ?-ASA 81, Plavix 75 ?-LDL goal < 70 ?-Up with assitance ?-PT/OT/SLP ? ? ?HTN ?BP range 170/70-80's,  ?-Losartan 100 mg daily ?-Doxazosin 2mg  qhs ? ?HLD ?-Atorvastatin 80 mg ? ?FEN/GI: Heart healthy ?PPx: Lovenox ?Dispo: Home with outpatient PT  today.   ? ?Subjective:  ?Patient feeling well this morning and wanting to go home.  ? ?Objective: ?Temp:  [98 ?F (36.7 ?C)-98.9 ?F (37.2 ?C)] 98.9 ?F (37.2 ?C) (05/12 0346) ?Pulse Rate:  [57-77] 68 (05/12 0346) ?Resp:  [15-16] 16 (05/12 0346) ?BP: (159-179)/(63-80) 175/76 (05/12 0346) ?SpO2:  [95 %-99 %] 95 % (05/12 0346) ?Physical Exam: ?General: Well appearing, NAD, elderly, white woman ?Cardiovascular: RRR, NRMG ?Respiratory: CTABL ?Abdomen: Soft, nttp, non-distended ?Extremities: No edema ?Neuro: Strength grossly 5/5, sensation intact throughout, no facial  asymmetry, EOMI, able to ambulate and balance minimally holding on to countertop, speech intact, no dysarthria or aphasia ? ?Laboratory: ?Recent Labs  ?Lab 11/08/21 ?2136  ?WBC 6.0  ?HGB 13.8  ?HCT 40.9  ?PLT 192  ? ?Recent Labs  ?Lab 11/08/21 ?2136 11/09/21 ?0510 11/10/21 ?0342  ?NA 141 142 139  ?K 3.6 3.7 3.5  ?CL 108 110 108  ?CO2 25 26 25   ?BUN 13 11 12   ?CREATININE 1.11* 1.07* 1.00  ?CALCIUM 9.5 9.0 9.1  ?PROT 6.3* 5.9*  --   ?BILITOT 0.7 0.7  --   ?ALKPHOS 53 52  --   ?ALT 16 16  --   ?AST 18 18  --   ?GLUCOSE 107* 99 106*  ? ? ? ? ?Imaging/Diagnostic Tests: ? ? ?01/10/22, MD ?11/12/2021, 6:07 AM ?PGY-1, [facility] ?FPTS Intern pager: [phone], text pages welcome ? ?

## 2021-11-12 NOTE — TOC Transition Note (Signed)
Transition of Care (TOC) - CM/SW Discharge Note ? ? ?Patient Details  ?Name: Janice [name] ?MRN: [id number] ?Date of Birth: 08-02-1940 ? ?Transition of Care (TOC) CM/SW Contact:  ?Kermit Balo, RN ?Phone Number: ?11/12/2021, 11:32 AM ? ? ?Clinical Narrative:    ?Patient discharging home with outpatient therapy through [facility]. Information on the AVS.  ?Pt has support at home and transportation to home.  ? ? ?Final next level of care: OP Rehab ?Barriers to Discharge: No Barriers Identified ? ? ?Patient Goals and CMS Choice ?  ?CMS Medicare.gov Compare Post Acute Care list provided to:: Patient ?Choice offered to / list presented to : Patient, Adult Children ? ?Discharge Placement ?  ?           ?  ?  ?  ?  ? ?Discharge Plan and Services ?  ?Discharge Planning Services: CM Consult ?           ?  ?  ?  ?  ?  ?  ?  ?  ?  ?  ? ?Social Determinants of Health (SDOH) Interventions ?  ? ? ?Readmission Risk Interventions ?   ? View : No data to display.  ?  ?  ?  ? ? ? ? ? ?

## 2021-11-12 NOTE — Care Management Important Message (Signed)
Important Message ? ?Patient Details  ?Name: Janice [name] ?MRN: [id number] ?Date of Birth: 01/14/1941 ? ? ?Medicare Important Message Given:  Yes ? ? ? ? ?Janice [name] ?11/12/2021, 3:12 PM ?

## 2021-11-12 NOTE — Discharge Summary (Signed)
Family Medicine Teaching Service ?Hospital Discharge Summary ? ?Patient name: Janice [name] Medical record number: [id number] ?Date of birth: Dec 02, 1940 Age: 81 y.o. Gender: female ?Date of Admission: 11/08/2021  Date of Discharge: 11/12/21 ?Admitting Physician: Darral Dash, DO ? ?Primary Care Provider: Toma Deiters, MD ?Consultants: Neurology ? ?Indication for Hospitalization: Stroke ? ?Discharge Diagnoses/Problem List:  ?Basal Ganglia Stroke ?HTN ?HLD ?Bigeminy ? ?Disposition: Home ? ?Discharge Condition: Improved ? ?Discharge Exam:  ?Temp:  [98 ?F (36.7 ?C)-98.9 ?F (37.2 ?C)] 98.9 ?F (37.2 ?C) (05/12 0346) ?Pulse Rate:  [57-77] 68 (05/12 0346) ?Resp:  [15-16] 16 (05/12 0346) ?BP: (159-179)/(63-80) 175/76 (05/12 0346) ?SpO2:  [95 %-99 %] 95 % (05/12 0346) ?Physical Exam: ?General: Well appearing, NAD, elderly, white woman ?Cardiovascular: RRR, NRMG ?Respiratory: CTABL ?Abdomen: Soft, nttp, non-distended ?Extremities: No edema ?Neuro: Strength grossly 5/5, sensation intact throughout, no facial asymmetry, EOMI, able to ambulate and balance minimally holding on to countertop, speech intact, no dysarthria or aphasia ? ?Brief Hospital Course:  ?[name] is a 81 y.o. female presenting with aphasia and dysarthria. PMH is significant for hypertension, remotely hyperlipidemia and tobacco use. ?  ?CVA  L. Basal Ganglia Stroke ?Patient presented with aphasia and dysarthria; after prolonged period of symptom on-start, (went to sleep after symptom onset, and went to ED following morning).  CT head showed no acute intracranial abnormalities.  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 CBC and BMP in ED within normal limits. Neurology was consulted and patient was started on ASA 81 and Plavix 75. She was to continue both for 3 months and then ASA alone. PT/OT saw and evaluated patient and felt she was benefit from continued outpatient PT/OT. Patient had no FND on  discharge. ? ?HLD ?Patient had Total Chol 207, and LDL 141, with history of high intensity statin, appears as though compliance has been an issue. Patient was encouraged to take statin at d/c ? ?Bigeminy ?EKG shows sinus rhythm with bigeminy on admission, with HR ranging from 60-70.  Does not report history of any cardiac problems.  She remained stable throughout admission. Patient had Zio Patch sent to home.  ? ? ?Discharge follow up considerations: ?PCP to refer patient for outpatient PT/OT and speech therapies.  ?Lipid panel high on admission. Total 207, LDL 141.Started atorvastatin 80 mg daily prior to discharge, tolerated well. PCP to recheck LDL in 3 months.  ?Patient likely not taking statin, encourage in outpatient setting ?TSH on admission 4.766, demonstrates subclinical hypothyroidism, cannot diagnose in acute period. PCP to follow and repeat TSH in 6 weeks.  ?Follow up with cardiology for HFpEF on echo in outpatient ?Patient had Zio patch sent to home, will need follow up for placement and return.  ?Unsure why pt is on high dose doxazosin, would consider stopping and starting more traditional anti-hypertensive  ?Held metoprolol, consider restarting outpatient ?Reduced doxazocin to 2mg  ? ? ?Significant Procedures: None ? ?Significant Labs and Imaging:  ?Recent Labs  ?Lab 11/08/21 ?2136  ?WBC 6.0  ?HGB 13.8  ?HCT 40.9  ?PLT 192  ? ?Recent Labs  ?Lab 11/08/21 ?2136 11/09/21 ?0510 11/10/21 ?0342  ?NA 141 142 139  ?K 3.6 3.7 3.5  ?CL 108 110 108  ?CO2 25 26 25   ?GLUCOSE 107* 99 106*  ?BUN 13 11 12   ?CREATININE 1.11* 1.07* 1.00  ?CALCIUM 9.5 9.0 9.1  ?ALKPHOS 53 52  --   ?AST 18 18  --   ?ALT 16 16  --   ?  ALBUMIN 3.8 3.6  --   ? ? ? ?Results/Tests Pending at Time of Discharge:  ?None ? ?Discharge Medications:  ?Allergies as of 11/12/2021   ? ?   Reactions  ? Norvasc [amlodipine] Swelling  ? ?  ? ?  ?Medication List  ?  ? ?STOP taking these medications   ? ?metoprolol succinate 100 MG 24 hr tablet ?Commonly known  as: TOPROL-XL ?  ? ?  ? ?TAKE these medications   ? ?Aspirin Low Dose 81 MG EC tablet ?Generic drug: aspirin ?Take 1 tablet (81 mg total) by mouth daily. Swallow whole. ?  ?atorvastatin 80 MG tablet ?Commonly known as: LIPITOR ?Take 1 tablet (80 mg total) by mouth daily. ?  ?clopidogrel 75 MG tablet ?Commonly known as: PLAVIX ?Take 1 tablet (75 mg total) by mouth daily. ?  ?doxazosin 2 MG tablet ?Commonly known as: CARDURA ?Take 1 tablet (2 mg total) by mouth at bedtime. ?What changed:  ?medication strength ?how much to take ?  ?losartan 100 MG tablet ?Commonly known as: COZAAR ?Take 100 mg by mouth daily. ?  ? ?  ? ? ?Discharge Instructions: Please refer to Patient Instructions section of EMR for full details.  Patient was counseled important signs and symptoms that should prompt return to medical care, changes in medications, dietary instructions, activity restrictions, and follow up appointments.  ? ?Follow-Up Appointments: ? Follow-up Information   ? ? [facility] Follow up.   ?Specialty: Rehabilitation ?Why: The outpatient rehab will contact you for the first appointment. ?Contact information: ?[address] ?[address] ?[id number] mc ?[place] [address] ?[phone] ? ?  ?  ? ? Janice Red, MD Follow up.   ?Specialty: Cardiology ?Why: Cardiology office will mail you a 30 day monitor to wear to evaluate for any abnormal heart rhythms. This will come with instructions for use but please contact the monitor company listed on the box or our office number above if you have any questions. You will have a cardiology follow-up visit to review the monitor results at our [facility] location on Thursday Dec 23, 2021 at 8:20 AM. Please arrive 15 minutes early to check in. ?Contact information: ?[address] ?[place] [address] ?[phone] ? ? ?  ?  ? ? Toma Deiters, MD. Schedule an appointment as soon as possible for a visit in  1 week(s).   ?Specialty: Internal Medicine ?Why: Make a follow up appointment with your PCP ?Contact information: ?[address] ?[place] [address] ?[phone] ? ? ?  ?  ? ?  ?  ? ?  ? ? ?Bess Kinds, MD ?11/12/2021, 9:00 PM ?PGY-1, [facility] ? ?

## 2021-11-12 NOTE — Progress Notes (Signed)
Occupational Therapy Treatment ?Patient Details ?Name: Janice [name] ?MRN: [id number] ?DOB: February 27, 1941 ?Today's Date: 11/12/2021 ? ? ?History of present illness Janice [name] is a 81 y.o. 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 Found this admission: Bigeminy and possible CKD. PMH is significant for hypertension, remotely hyperlipidemia and tobacco use. ?  ?OT comments ? Pt dressed and eager to go home with family in room. Pt without s/s of orthostatic hypotension and has been walking to the bathroom and around her room with her family. Focus of session on educating pt and family in possible functional deficits pt may experience during ADLs, IADLs related to impaired problem solving and memory deficits. Pt with improved verbalization today. Does not recall being in the ED for 2 days. Recommended seated showering initially with supervision. Family verbalizing understanding.   ? ?Recommendations for follow up therapy are one component of a multi-disciplinary discharge planning process, led by the attending physician.  Recommendations may be updated based on patient status, additional functional criteria and insurance authorization. ?   ?Follow Up Recommendations ? Outpatient OT  ?  ?Assistance Recommended at Discharge Frequent or constant Supervision/Assistance  ?Patient can return home with the following ? A little help with bathing/dressing/bathroom;A lot of help with walking and/or transfers;Help with stairs or ramp for entrance;Assist for transportation;Assistance with cooking/housework;Direct supervision/assist for financial management;Direct supervision/assist for medications management ?  ?Equipment Recommendations ? None recommended by OT  ?  ?Recommendations for Other Services   ? ?  ?Precautions / Restrictions Precautions ?Precautions: Fall ?Restrictions ?Weight Bearing Restrictions: No  ? ? ?  ? ?Mobility Bed  Mobility ?Overal bed mobility: Modified Independent ?  ?  ?  ?  ?  ?  ?  ?  ? ?Transfers ?Overall transfer level: Needs assistance ?  ?Transfers: Sit to/from Stand ?Sit to Stand: Min guard ?  ?  ?  ?  ?  ?  ?  ?  ?Balance Overall balance assessment: Needs assistance ?Sitting-balance support: Feet supported ?Sitting balance-Leahy Scale: Good ?  ?  ?  ?Standing balance-Leahy Scale: Fair ?  ?  ?  ?  ?  ?  ?  ?  ?  ?  ?  ?  ?   ? ?ADL either performed or assessed with clinical judgement  ? ?ADL Overall ADL's : Needs assistance/impaired ?  ?  ?  ?  ?  ?  ?  ?  ?  ?  ?  ?  ?Toilet Transfer: Min guard;Ambulation ?  ?Toileting- Clothing Manipulation and Hygiene: Sit to/from stand;Min guard ?  ?  ?  ?Functional mobility during ADLs: Min guard ?General ADL Comments: Pt has been mobilizing around room and to bathroom with family without s/s orthostatic hypotension. Recommended pt use shower seat intially when returning home. ?  ? ?Extremity/Trunk Assessment   ?  ?  ?  ?  ?  ? ?Vision   ?  ?  ?Perception   ?  ?Praxis   ?  ? ?Cognition Arousal/Alertness: Awake/alert ?Behavior During Therapy: [facility] for tasks assessed/performed ?Overall Cognitive Status: Impaired/Different from baseline ?Area of Impairment: Problem solving, Memory, Safety/judgement ?  ?  ?  ?  ?  ?  ?  ?  ?  ?  ?Memory: Decreased short-term memory ?  ?Safety/Judgement: Decreased awareness of deficits, Decreased awareness of safety ?  ?Problem Solving: Slow processing, Requires verbal cues ?General Comments: Educated patient and family in cognitive deficits and  possible impact on safety during ADLs, IADLs including meal prep, med administration, bill paying. ?  ?  ?   ?Exercises   ? ?  ?Shoulder Instructions   ? ? ?  ?General Comments    ? ? ?Pertinent Vitals/ Pain       Pain Assessment ?Pain Assessment: No/denies pain ? ?Home Living   ?  ?  ?  ?  ?  ?  ?  ?  ?  ?  ?  ?  ?  ?  ?  ?  ?  ?  ? ?  ?Prior Functioning/Environment    ?  ?  ?  ?   ? ?Frequency ? Min 2X/week   ? ? ? ? ?  ?Progress Toward Goals ? ?OT Goals(current goals can now be found in the care plan section) ? Progress towards OT goals: Progressing toward goals ? ?Acute Rehab OT Goals ?OT Goal Formulation: With patient/family ?Time For Goal Achievement: 11/23/21 ?Potential to Achieve Goals: Good  ?Plan Discharge plan remains appropriate   ? ?Co-evaluation ? ? ?   ?  ?  ?  ?  ? ?  ?AM-PAC OT "6 Clicks" Daily Activity     ?Outcome Measure ? ? Help from another person eating meals?: None ?Help from another person taking care of personal grooming?: A Little ?Help from another person toileting, which includes using toliet, bedpan, or urinal?: A Little ?Help from another person bathing (including washing, rinsing, drying)?: A Little ?Help from another person to put on and taking off regular upper body clothing?: A Little ?Help from another person to put on and taking off regular lower body clothing?: A Little ?6 Click Score: 19 ? ?  ?End of Session   ? ?OT Visit Diagnosis: Unsteadiness on feet (R26.81);Other abnormalities of gait and mobility (R26.89);Other symptoms and signs involving cognitive function ?  ?Activity Tolerance Patient tolerated treatment well ?  ?Patient Left in bed;with call bell/phone within reach;with family/visitor present ?  ?Nurse Communication   ?  ? ?   ? ?Time: 0626-9485 ?OT Time Calculation (min): 18 min ? ?Charges: OT General Charges ?$OT Visit: 1 Visit ?OT Treatments ?$Self Care/Home Management : 8-22 mins ? ?Martie Round, OTR/L ?Acute Rehabilitation Services ?Pager: [phone] ?Office: [phone]  ? ?Evern Bio ?11/12/2021, 1:53 PM ?

## 2021-11-12 NOTE — Progress Notes (Signed)
FPTS Brief Progress Note ? ?S: sleeping ? ? ?O: ?BP (!) 174/76 (BP Location: Left Arm)   Pulse 77   Temp 98.7 ?F (37.1 ?C) (Oral)   Resp 15   Ht 5\' 4"  (1.626 m)   Wt 62.1 kg   SpO2 97%   BMI 23.50 kg/m?   ? ? ?A/P: ?- Orders reviewed. Labs for AM not ordered, which was adjusted as needed.  ? ? , MD ?11/12/2021, 3:21 AM ?PGY-3, [facility] Night Resident  ?Please page [phone] with questions.  ? ? ?

## 2021-11-15 ENCOUNTER — Other Ambulatory Visit: Payer: Self-pay | Admitting: Family Medicine

## 2021-11-17 DIAGNOSIS — G458 Other transient cerebral ischemic attacks and related syndromes: Secondary | ICD-10-CM | POA: Diagnosis not present

## 2021-11-25 ENCOUNTER — Ambulatory Visit (HOSPITAL_COMMUNITY): Payer: Medicare Other | Attending: Family Medicine | Admitting: Speech Pathology

## 2021-11-25 ENCOUNTER — Encounter (HOSPITAL_COMMUNITY): Payer: Self-pay | Admitting: Speech Pathology

## 2021-11-25 DIAGNOSIS — R4701 Aphasia: Secondary | ICD-10-CM | POA: Diagnosis not present

## 2021-11-25 DIAGNOSIS — R41841 Cognitive communication deficit: Secondary | ICD-10-CM | POA: Insufficient documentation

## 2021-11-25 NOTE — Therapy (Signed)
OUTPATIENT SPEECH LANGUAGE PATHOLOGY APHASIA EVALUATION   Patient Name: Janice [name] MRN: [id number] DOB:06-Mar-1941, 81 y.o., female Today's Date: 11/25/2021  PCP: Toma Deiters, MD REFERRING PROVIDER: Moses Manners, MD   End of Session - 11/25/21 1603     Visit Number 1    Number of Visits 9    Date for SLP Re-Evaluation 12/30/21    Authorization Type Medicare    SLP Start Time 0900    SLP Stop Time  0945    SLP Time Calculation (min) 45 min    Activity Tolerance Patient tolerated treatment well             Past Medical History:  Diagnosis Date   Anemia    History of blood transfusion    no reaction   Hypertension    Past Surgical History:  Procedure Laterality Date   CATARACT EXTRACTION W/PHACO  01/23/2012   Procedure: CATARACT EXTRACTION PHACO AND INTRAOCULAR LENS PLACEMENT (IOC);  Surgeon: Gemma Payor, MD;  Location: [facility];  Service: Ophthalmology;  Laterality: Right;  CDE 18.26   CATARACT EXTRACTION W/PHACO  02/13/2012   Procedure: CATARACT EXTRACTION PHACO AND INTRAOCULAR LENS PLACEMENT (IOC);  Surgeon: Gemma Payor, MD;  Location: [facility];  Service: Ophthalmology;  Laterality: Left;  CDE:14.14   CHOLECYSTECTOMY  1981   DILATION AND CURETTAGE OF UTERUS     TONSILLECTOMY  age 37   TUBAL LIGATION     Patient Active Problem List   Diagnosis Date Noted   Stroke (HCC) 11/10/2021   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 female presenting with aphasia and dysarthria after referral from Dr. Doralee Albino. PMH is significant for hypertension, remotely hyperlipidemia and tobacco use. Patient presented  with aphasia and dysarthria; after prolonged period of symptom on-start, (went to sleep after symptom onset, and went to ED following morning). CT head showed no acute intracranial abnormalities.  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 Pt was admitted to [facility] from 11/09/2021 to 11/11/2021 and discharged home. Her two daughters are currently staying with her, however she was completely independent prior to this stroke.   PAIN:  Are you having pain? No  FALLS: Has patient fallen in last 6 months?  No  LIVING ENVIRONMENT: Lives with: lives alone and two daughters are currently staying with her Lives in: House/apartment  PLOF:  Level of assistance: Independent with ADLs Employment: Retired   PATIENT GOALS Improve speech  OBJECTIVE:   DIAGNOSTIC FINDINGS: Administration of the [facility] SLUMS and MAST  COGNITION: Overall cognitive status: Impaired: Attention: Impaired: Sustained, Memory: Impaired: Working Teacher, music term, and Psychologist, educational function: Impaired: Problem solving, Organization, Planning, and Self-correction Areas of impairment: Attention and Memory Comments: 0/5 delayed recall  AUDITORY COMPREHENSION: Overall auditory comprehension: {IMPAIRED:25374} YES/NO questions: {IMPAIRED:25374} Following directions: {IMPAIRED:25374} Conversation: {SLP conversation:25430} Interfering components: {SLP interfering components:25431} Effective technique: {SLP effective technique:25432}   READING COMPREHENSION: {SLPreadingcomprehension:27140}  EXPRESSION: {SLP EXPRESSION:25433}  VERBAL EXPRESSION: Level of generative/spontaneous verbalization: {SLP level of generative/spontaneious verbalization:25435} Automatic speech: {SLP ATOMIC SPEECH:25434}  Repetition: {SLPrepetion:27212} Naming: {SLPnaming:27214} Pragmatics: {slppragmatics:27216} Comments: *** Interfering components: {SLP INTERFERING COMPONENTS:25436} Effective technique: {SLP EFFECTIVE  TECHNIQUE:25437} Non-verbal means of communication: {SLP non verbal means of communication:25438}  WRITTEN EXPRESSION: Dominant hand: {RIGHT/LEFT:20294}  Written expression: {slpwrittenexp:27209}  MOTOR SPEECH: Overall motor speech: {slpimpaired:27210} Level of impairment: {SLP level of impairment:25441} Respiration: {respbreathing:27195} Phonation: {SLP phonation:25439} Resonance: {SLP resonance:25440} Articulation: {SLParticulation:27218} Intelligibility: {SLP Intelligible:25442} Motor planning: {slpmotorspeecherrors:27220} Motor speech errors: {SLP motor speech errors:25443} Interfering components: {SLP Interfering components (MS):25444} Effective technique: {SLP effective technique (MS):25445}   ORAL MOTOR EXAMINATION Facial : Symmetry impaired: {OM facial:25656} Lingual: {OM Lingual:25664} Velum: {OM Velum:25665} Mandible: {OM mandible:25667} Cough: {OM Cough:25660} Voice: {OM Voice:25662}  [facility] SLUMS Examination Orientation  1/3  Numeric Problem Solving  0/3  Memory  0/5  Attention 1/2  Thought Organization 1/3  Clock Drawing 0/4  Visuospatial Skills               2/2  Short Story Recall  0/8  Total  6/30     Scoring  High School Education  Less than High School Education   Normal  27-30 25-30  Mild Neurocognitive Disorder 21-26 20-24  Dementia  1-20 1-19   MAST Pt was administered the [place] Aphasia Screening Tool (MAST) and achieved an overall expressive score of 49/50, receptive score of 46/50, and a total score of 95/100.  Expressive Index Naming 10/10 Automatic Speech 9/10 Repetition 10/10 Writing 10/10 Verbal Fluency 10/10 Expressive Subscale 49/50  Receptive Index Yes/No Accuracy 18/20 Object Recognition 10/10 Following Instructions 8/10 Reading Instructions 10/10 Receptive Subscale 46/50  Total Index Expressive 49/50 Receptive 46/50 Total Score 95/100    PATIENT REPORTED OUTCOME MEASURES (PROM): {SLPPROM:27095}   TODAY'S  TREATMENT:  ***   PATIENT EDUCATION: Education details: *** Person educated: {Person educated:25204} Education method: {Education Method:25205} Education comprehension: {Education Comprehension:25206}   GOALS: Goals reviewed with patient? {yes/no:20286}  SHORT TERM GOALS: Target date: {follow up:25551}  (Remove Blue Hyperlink)  *** Baseline: Goal status: {GOALSTATUS:25110}  2.  *** Baseline:  Goal status: {GOALSTATUS:25110}  3.  *** Baseline:  Goal status: {GOALSTATUS:25110}  4.  *** Baseline:  Goal status: {GOALSTATUS:25110}  5.  *** Baseline:  Goal status: {GOALSTATUS:25110}  6.  *** Baseline:  Goal status: {GOALSTATUS:25110}  LONG TERM GOALS: Target date: {follow up:25551}  (Remove Blue Hyperlink)  *** Baseline:  Goal status: {GOALSTATUS:25110}  2.  *** Baseline:  Goal status: {GOALSTATUS:25110}  3.  *** Baseline:  Goal status: {GOALSTATUS:25110}  4.  *** Baseline:  Goal status: {GOALSTATUS:25110}  5.  *** Baseline:  Goal status: {GOALSTATUS:25110}  6.  *** Baseline:  Goal status: {GOALSTATUS:25110}  ASSESSMENT:  CLINICAL IMPRESSION: Patient is a *** y.o. *** who was seen today for ***.   OBJECTIVE IMPAIRMENTS include {SLPOBJIMP:27107}. These impairments are limiting patient from {SLPLIMIT:27108}. Factors affecting potential to achieve goals and functional outcome are {SLP factors:25450}. Patient will benefit from skilled SLP services to address above impairments and improve overall function.  REHAB POTENTIAL: {rehabpotential:25112}  PLAN: SLP FREQUENCY: 2x/week  SLP DURATION: 4 weeks  PLANNED INTERVENTIONS: Cueing hierachy, Internal/external aids, Functional tasks, Multimodal communication approach, and SLP instruction and feedback    Thank you,  [name], CCC-SLP [phone]  Sarahgrace Broman, CCC-SLP 11/25/2021, 4:05 PM

## 2021-11-30 ENCOUNTER — Ambulatory Visit (INDEPENDENT_AMBULATORY_CARE_PROVIDER_SITE_OTHER): Payer: Medicare Other

## 2021-11-30 DIAGNOSIS — I639 Cerebral infarction, unspecified: Secondary | ICD-10-CM

## 2021-11-30 DIAGNOSIS — I4891 Unspecified atrial fibrillation: Secondary | ICD-10-CM

## 2021-12-01 DIAGNOSIS — I150 Renovascular hypertension: Secondary | ICD-10-CM | POA: Diagnosis not present

## 2021-12-02 ENCOUNTER — Encounter (HOSPITAL_COMMUNITY): Payer: Self-pay | Admitting: Speech Pathology

## 2021-12-02 ENCOUNTER — Ambulatory Visit (HOSPITAL_COMMUNITY): Payer: Medicare Other | Attending: Internal Medicine | Admitting: Speech Pathology

## 2021-12-02 DIAGNOSIS — I10 Essential (primary) hypertension: Secondary | ICD-10-CM | POA: Diagnosis not present

## 2021-12-02 DIAGNOSIS — R4701 Aphasia: Secondary | ICD-10-CM | POA: Insufficient documentation

## 2021-12-02 DIAGNOSIS — E785 Hyperlipidemia, unspecified: Secondary | ICD-10-CM | POA: Insufficient documentation

## 2021-12-02 DIAGNOSIS — R471 Dysarthria and anarthria: Secondary | ICD-10-CM | POA: Diagnosis not present

## 2021-12-02 DIAGNOSIS — Z720 Tobacco use: Secondary | ICD-10-CM | POA: Insufficient documentation

## 2021-12-02 DIAGNOSIS — R41841 Cognitive communication deficit: Secondary | ICD-10-CM | POA: Insufficient documentation

## 2021-12-06 ENCOUNTER — Ambulatory Visit (HOSPITAL_COMMUNITY): Payer: Medicare Other | Attending: Family Medicine | Admitting: Speech Pathology

## 2021-12-06 ENCOUNTER — Encounter (HOSPITAL_COMMUNITY): Payer: Self-pay | Admitting: Speech Pathology

## 2021-12-06 DIAGNOSIS — R29818 Other symptoms and signs involving the nervous system: Secondary | ICD-10-CM | POA: Insufficient documentation

## 2021-12-06 DIAGNOSIS — R4701 Aphasia: Secondary | ICD-10-CM | POA: Diagnosis not present

## 2021-12-06 DIAGNOSIS — R41841 Cognitive communication deficit: Secondary | ICD-10-CM | POA: Insufficient documentation

## 2021-12-09 ENCOUNTER — Ambulatory Visit (HOSPITAL_COMMUNITY): Payer: Medicare Other | Attending: Family Medicine | Admitting: Speech Pathology

## 2021-12-09 ENCOUNTER — Encounter (HOSPITAL_COMMUNITY): Payer: Self-pay | Admitting: Speech Pathology

## 2021-12-09 DIAGNOSIS — R41841 Cognitive communication deficit: Secondary | ICD-10-CM | POA: Diagnosis not present

## 2021-12-09 DIAGNOSIS — R4701 Aphasia: Secondary | ICD-10-CM | POA: Diagnosis not present

## 2021-12-09 NOTE — Therapy (Signed)
OUTPATIENT SPEECH LANGUAGE PATHOLOGY TREATMENT NOTE   Patient Name: Janice [name] MRN: [id number] DOB:10/24/1940, 81 y.o., female Today's Date: 12/09/2021  PCP: Toma Deiters, MD  REFERRING PROVIDER: Moses Manners, MD   END OF SESSION:   End of Session - 12/06/21 1458     Visit Number 3    Number of Visits 9    Date for SLP Re-Evaluation 12/30/21    Authorization Type Medicare    SLP Start Time 0945    SLP Stop Time  1030    SLP Time Calculation (min) 45 min    Activity Tolerance Patient tolerated treatment well             Past Medical History:  Diagnosis Date   Anemia    History of blood transfusion    no reaction   Hypertension    Past Surgical History:  Procedure Laterality Date   CATARACT EXTRACTION W/PHACO  01/23/2012   Procedure: CATARACT EXTRACTION PHACO AND INTRAOCULAR LENS PLACEMENT (IOC);  Surgeon: Gemma Payor, MD;  Location: [facility];  Service: Ophthalmology;  Laterality: Right;  CDE 18.26   CATARACT EXTRACTION W/PHACO  02/13/2012   Procedure: CATARACT EXTRACTION PHACO AND INTRAOCULAR LENS PLACEMENT (IOC);  Surgeon: Gemma Payor, MD;  Location: [facility];  Service: Ophthalmology;  Laterality: Left;  CDE:14.14   CHOLECYSTECTOMY  1981   DILATION AND CURETTAGE OF UTERUS     TONSILLECTOMY  age 89   TUBAL LIGATION     Patient Active Problem List   Diagnosis Date Noted   Stroke (HCC) 11/10/2021   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 female presenting with aphasia and dysarthria after referral from Dr. Doralee Albino. PMH is significant for hypertension, remotely hyperlipidemia and tobacco use. Patient presented with aphasia and dysarthria; after prolonged period of symptom on-start, (went to sleep after symptom onset,  and went to ED following morning). CT head showed no acute intracranial abnormalities.  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 Pt was admitted to [facility] from 11/09/2021 to 11/11/2021 and discharged home. Her two daughters are currently staying with her, however she was completely independent prior to this stroke.   SUBJECTIVE: "to the beach"  PAIN:  Are you having pain? No   GOALS: Goals reviewed with patient? Yes   SHORT TERM GOALS: Target date: 01/04/2022    Pt will record 3 or greater weekly appointments, reminders, to-do items in her notebook/calendar to facilitate task completion and orientation. Baseline: Not using currently, 1/5 delayed recall, poor orientation   Goal status: ONGOING   2.  Pt will increase naming of low frequency objects/pictures to 90% acc when provided with min multimodality cues Baseline: 50% Goal status: ONGOING   3.  Pt will describe objects and pictures by providing at least three salient features as judged by clinician with 90% acc when provided min cues.  Baseline: 75% mod assist Goal status: ONGOING   4.  Pt will respond to open-ended questions using a complete sentence with 80% acc and min cues. Baseline: responds in short phrases Goal status: ONGOING   5.  Pt will implement speech intelligibility strategies during conversational speech (decrease rate, self correct errors, self monitoring, checking to see if listener understands) with min assist.  Baseline: mod assist Goal status: ONGOING   6.  Pt will implement word-finding strategies with 90% accuracy when unable to verbalize desired word in conversation/functional tasks with min assist. Baseline: 75% Goal status: ONGOING   LONG TERM GOALS: Target date: 02/01/2022  Pt will communicate moderate level wants/needs/thoughts to family and friends with use of multimodality communication strategies as needed. Baseline: mod assist Goal status: INITIAL   2.   Pt will increase memory for functional tasks at home with use of compensatory strategies to [facility] with intermittent supervision. Baseline: full time supervision at this time Goal status: INITIAL OBJECTIVE:  OBJECTIVE IMPAIRMENTS include attention, memory, executive functioning, and expressive language. These impairments are limiting patient from managing medications, managing appointments, and effectively communicating at home and in community. Factors affecting potential to achieve goals and functional outcome are ability to learn/carryover information. Patient will benefit from skilled SLP services to address above impairments and improve overall function.   REHAB POTENTIAL: Good   PLAN: SLP FREQUENCY: 2x/week   SLP DURATION: 4 weeks   PLANNED INTERVENTIONS: Cueing hierachy, Internal/external aids, Functional tasks, Multimodal communication approach, and SLP instruction and feedback  TODAY'S TREATMENT: Pt was accompanied to therapy by her daughter, Janice [name]. SLP provided skilled intervention to address language, speech, and cognitive communication deficits using response elaboration training, confrontation naming, written prompts, reinforcement, corrective feedback, and reassessment of needs with adjustments in session with caregiver present for treatment. Pt completed reading comprehension portion from the [place] Aphasia Battery with 80% acc. She had difficulty with lengthier, more complex text. She completed confrontation naming tasks for low frequency pictured objects with 80% acc. She was able to provide sentence length description of action photos with 90% acc with min assist provided. Additional names of family members were added to her personalized communication template. Continue plan of care.    Thank you,  [name], CCC-SLP [phone]  [name], CCC-SLP 12/06/2021, 3:12 PM

## 2021-12-09 NOTE — Therapy (Signed)
OUTPATIENT SPEECH LANGUAGE PATHOLOGY TREATMENT NOTE   Patient Name: Janice [name] MRN: [id number] DOB:[birth year], 81 y.o., female Today's Date: 12/09/2021  PCP: Toma DeitersHasanaj, Xaje A, MD  REFERRING PROVIDER: Moses MannersHensel, William A, MD   END OF SESSION:   End of Session - 12/09/21 1458     Visit Number 4   Number of Visits 9    Date for SLP Re-Evaluation 12/30/21    Authorization Type Medicare    SLP Start Time 0907   SLP Stop Time  0952   SLP Time Calculation (min) 45 min    Activity Tolerance Patient tolerated treatment well             Past Medical History:  Diagnosis Date   Anemia    History of blood transfusion    no reaction   Hypertension    Past Surgical History:  Procedure Laterality Date   CATARACT EXTRACTION W/PHACO  01/23/2012   Procedure: CATARACT EXTRACTION PHACO AND INTRAOCULAR LENS PLACEMENT (IOC);  Surgeon: Gemma PayorKerry Hunt, MD;  Location: [facility];  Service: Ophthalmology;  Laterality: Right;  CDE 18.26   CATARACT EXTRACTION W/PHACO  02/13/2012   Procedure: CATARACT EXTRACTION PHACO AND INTRAOCULAR LENS PLACEMENT (IOC);  Surgeon: Gemma PayorKerry Hunt, MD;  Location: [facility];  Service: Ophthalmology;  Laterality: Left;  CDE:14.14   CHOLECYSTECTOMY  1981   DILATION AND CURETTAGE OF UTERUS     TONSILLECTOMY  age 89   TUBAL LIGATION     Patient Active Problem List   Diagnosis Date Noted   Stroke (HCC) 11/10/2021   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 female presenting with aphasia and dysarthria after referral from Dr. Doralee AlbinoWilliam Hensel. PMH is significant for hypertension, remotely hyperlipidemia and tobacco use. Patient presented with aphasia and dysarthria; after prolonged period of symptom on-start, (went to sleep after symptom onset, and  went to ED following morning). CT head showed no acute intracranial abnormalities.  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 Pt was [facility] from 11/09/2021 to 11/11/2021 and discharged home. Her two daughters are currently staying with her, however she was completely independent prior to this stroke.   SUBJECTIVE: "It is ridiculous."  PAIN:  Are you having pain? No   GOALS: Goals reviewed with patient? Yes   SHORT TERM GOALS: Target date: 01/04/2022    Pt will record 3 or greater weekly appointments, reminders, to-do items in her notebook/calendar to facilitate task completion and orientation. Baseline: Not using currently, 1/5 delayed recall, poor orientation   Goal status: ONGOING   2.  Pt will increase naming of low frequency objects/pictures to 90% acc when provided with min multimodality cues Baseline: 50% Goal status: ONGOING   3.  Pt will describe objects and pictures by providing at least three salient features as judged by clinician with 90% acc when provided min cues.  Baseline: 75% mod assist Goal status: ONGOING   4.  Pt will respond to open-ended questions using a complete sentence with 80% acc and min cues. Baseline: responds in short phrases Goal status: ONGOING   5.  Pt will implement speech intelligibility strategies during conversational speech (decrease rate, self correct errors, self monitoring, checking to see if listener understands) with min assist. Baseline: mod assist  Goal status: ONGOING   6.  Pt will implement word-finding strategies with 90% accuracy when unable to verbalize desired word in conversation/functional tasks with min assist. Baseline: 75% Goal status: ONGOING   LONG TERM GOALS: Target date: 02/01/2022  Pt will communicate moderate level wants/needs/thoughts to family and friends with use of multimodality communication strategies as needed. Baseline: mod assist Goal status: INITIAL   2.   Pt will increase memory for functional tasks at home with use of compensatory strategies to [facility] with intermittent supervision. Baseline: full time supervision at this time Goal status: INITIAL OBJECTIVE:  OBJECTIVE IMPAIRMENTS include attention, memory, executive functioning, and expressive language. These impairments are limiting patient from managing medications, managing appointments, and effectively communicating at home and in community. Factors affecting potential to achieve goals and functional outcome are ability to learn/carryover information. Patient will benefit from skilled SLP services to address above impairments and improve overall function.   REHAB POTENTIAL: Good   PLAN: SLP FREQUENCY: 2x/week   SLP DURATION: 4 weeks   PLANNED INTERVENTIONS: Cueing hierachy, Internal/external aids, Functional tasks, Multimodal communication approach, and SLP instruction and feedback  TODAY'S TREATMENT: Pt was accompanied to therapy by her daughter, Olegario Shearer. SLP provided skilled intervention to address language, speech, and cognitive communication deficits using response elaboration training, confrontation naming, written prompts, reinforcement, corrective feedback, and reassessment of needs with adjustments in session with caregiver present for treatment. Pt reports that a goal is for her to be able to live by herself again so SLP facilitated identification of possible barriers which included: financial management, medication management, grocery shopping, driving, housekeeping, and possible fall risks. Vicky indicates that Pt is doing her laundry, but required assist to hang the clothes outside (dryer is broken and 3 steps outside). Vicky worries if her mother might fall in the bathroom, so a grab bar installation was suggested and also to bring her cell phone into the bathroom with her. Olegario Shearer was encouraged to allow her mother to try and take care of everything she physically can and only step in  when assistance is needed. She has been using her pill box for AM medications and a pill bottle for her PM medicine. They are going to the beach for a week and she was encouraged to complete some of HEP, but also reminded to take rest breaks from the rest of the family at times. She had difficulty navigating the calendar today (identifying the day/date) and was given one to track at home. Continue plan of care.    Thank you,  Genene Churn, [place]  [place], [place] 12/09/2021, 3:25 PM

## 2021-12-09 NOTE — Therapy (Signed)
OUTPATIENT SPEECH LANGUAGE PATHOLOGY TREATMENT NOTE   Patient Name: Janice [name] MRN: [id number] DOB:1940-11-18, 81 y.o., female Today's Date: 12/09/2021  PCP: Toma Deiters, MD  REFERRING PROVIDER: Moses Manners, MD   END OF SESSION:   End of Session - 12/09/21 1356     Visit Number 3    Number of Visits 9    Date for SLP Re-Evaluation 12/30/21    Authorization Type Medicare    SLP Start Time 0945    SLP Stop Time  1030    SLP Time Calculation (min) 45 min    Activity Tolerance Patient tolerated treatment well             Past Medical History:  Diagnosis Date   Anemia    History of blood transfusion    no reaction   Hypertension    Past Surgical History:  Procedure Laterality Date   CATARACT EXTRACTION W/PHACO  01/23/2012   Procedure: CATARACT EXTRACTION PHACO AND INTRAOCULAR LENS PLACEMENT (IOC);  Surgeon: Gemma Payor, MD;  Location: [facility];  Service: Ophthalmology;  Laterality: Right;  CDE 18.26   CATARACT EXTRACTION W/PHACO  02/13/2012   Procedure: CATARACT EXTRACTION PHACO AND INTRAOCULAR LENS PLACEMENT (IOC);  Surgeon: Gemma Payor, MD;  Location: [facility];  Service: Ophthalmology;  Laterality: Left;  CDE:14.14   CHOLECYSTECTOMY  1981   DILATION AND CURETTAGE OF UTERUS     TONSILLECTOMY  age 87   TUBAL LIGATION     Patient Active Problem List   Diagnosis Date Noted   Stroke (HCC) 11/10/2021   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 female presenting with aphasia and dysarthria after referral from Dr. Doralee Albino. PMH is significant for hypertension, remotely hyperlipidemia and tobacco use. Patient presented with aphasia and dysarthria; after prolonged period of symptom on-start, (went to sleep after symptom onset,  and went to ED following morning). CT head showed no acute intracranial abnormalities.  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 Pt was admitted to [facility] from 11/09/2021 to 11/11/2021 and discharged home. Her two daughters are currently staying with her, however she was completely independent prior to this stroke.   SUBJECTIVE: "I guess fine."  PAIN:  Are you having pain? No   GOALS: Goals reviewed with patient? Yes   SHORT TERM GOALS: Target date: 01/04/2022    Pt will record 3 or greater weekly appointments, reminders, to-do items in her notebook/calendar to facilitate task completion and orientation. Baseline: Not using currently, 1/5 delayed recall, poor orientation   Goal status: ONGOING   2.  Pt will increase naming of low frequency objects/pictures to 90% acc when provided with min multimodality cues Baseline: 50% Goal status: ONGOING   3.  Pt will describe objects and pictures by providing at least three salient features as judged by clinician with 90% acc when provided min cues.  Baseline: 75% mod assist Goal status: ONGOING   4.  Pt will respond to open-ended questions using a complete sentence with 80% acc and min cues. Baseline: responds in short phrases Goal status: ONGOING   5.  Pt will implement speech intelligibility strategies during conversational speech (decrease rate, self correct errors, self monitoring, checking to see if listener understands) with min assist.  Baseline: mod assist Goal status: ONGOING   6.  Pt will implement word-finding strategies with 90% accuracy when unable to verbalize desired word in conversation/functional tasks with min assist. Baseline: 75% Goal status: ONGOING   LONG TERM GOALS: Target date: 02/01/2022  Pt will communicate moderate level wants/needs/thoughts to family and friends with use of multimodality communication strategies as needed. Baseline: mod assist Goal status: INITIAL   2.   Pt will increase memory for functional tasks at home with use of compensatory strategies to [facility] with intermittent supervision. Baseline: full time supervision at this time Goal status: INITIAL OBJECTIVE:  OBJECTIVE IMPAIRMENTS include attention, memory, executive functioning, and expressive language. These impairments are limiting patient from managing medications, managing appointments, and effectively communicating at home and in community. Factors affecting potential to achieve goals and functional outcome are ability to learn/carryover information. Patient will benefit from skilled SLP services to address above impairments and improve overall function.   REHAB POTENTIAL: Good   PLAN: SLP FREQUENCY: 2x/week   SLP DURATION: 4 weeks   PLANNED INTERVENTIONS: Cueing hierachy, Internal/external aids, Functional tasks, Multimodal communication approach, and SLP instruction and feedback  TODAY'S TREATMENT: Pt was accompanied to therapy by her daughter, Janice [name]. Goals and plan for therapy were reviewed. SLP provided skilled intervention to address language, speech, and cognitive communication deficits using response elaboration training, confrontation naming, written prompts, reinforcement, corrective feedback, and reassessment of needs with adjustments in session with caregiver present for treatment. Pt completed confrontation naming tasks for low frequency pictured objects with 75% acc with semantic errors (snail/turtle, ping pong/tennis). She responded to "what doing" questions with 100% acc with mi/mod prompts from SLP. Pt required encouragement to increase vocal intensity after quietly rehearsing her desired verbalization. Continue plan of care.    Thank you,  [name], CCC-SLP [phone]  Joden Bonsall, CCC-SLP 12/02/2021, 1:58 PM

## 2021-12-17 ENCOUNTER — Ambulatory Visit (HOSPITAL_COMMUNITY): Payer: Medicare Other | Admitting: Physical Therapy

## 2021-12-17 ENCOUNTER — Ambulatory Visit (HOSPITAL_COMMUNITY): Payer: Medicare Other | Admitting: Occupational Therapy

## 2021-12-20 ENCOUNTER — Encounter (HOSPITAL_COMMUNITY): Payer: Self-pay | Admitting: Speech Pathology

## 2021-12-20 ENCOUNTER — Ambulatory Visit (HOSPITAL_COMMUNITY): Payer: Medicare Other | Admitting: Speech Pathology

## 2021-12-20 DIAGNOSIS — R4701 Aphasia: Secondary | ICD-10-CM | POA: Diagnosis not present

## 2021-12-20 DIAGNOSIS — R29818 Other symptoms and signs involving the nervous system: Secondary | ICD-10-CM | POA: Diagnosis not present

## 2021-12-20 DIAGNOSIS — R41841 Cognitive communication deficit: Secondary | ICD-10-CM

## 2021-12-20 NOTE — Therapy (Signed)
OUTPATIENT SPEECH LANGUAGE PATHOLOGY TREATMENT NOTE   Patient Name: Janice [name] MRN: [id number] DOB:1940-10-23, 81 y.o., female Today's Date: 12/20/2021  PCP: Toma Deiters, MD  REFERRING PROVIDER: Moses Manners, MD   END OF SESSION:   End of Session - 12/20/21 1458     Visit Number 5   Number of Visits 9    Date for SLP Re-Evaluation 12/30/21    Authorization Type Medicare    SLP Start Time 0945   SLP Stop Time  1030   SLP Time Calculation (min) 45 min    Activity Tolerance Patient tolerated treatment well             Past Medical History:  Diagnosis Date   Anemia    History of blood transfusion    no reaction   Hypertension    Past Surgical History:  Procedure Laterality Date   CATARACT EXTRACTION W/PHACO  01/23/2012   Procedure: CATARACT EXTRACTION PHACO AND INTRAOCULAR LENS PLACEMENT (IOC);  Surgeon: Gemma Payor, MD;  Location: [facility];  Service: Ophthalmology;  Laterality: Right;  CDE 18.26   CATARACT EXTRACTION W/PHACO  02/13/2012   Procedure: CATARACT EXTRACTION PHACO AND INTRAOCULAR LENS PLACEMENT (IOC);  Surgeon: Gemma Payor, MD;  Location: [facility];  Service: Ophthalmology;  Laterality: Left;  CDE:14.14   CHOLECYSTECTOMY  1981   DILATION AND CURETTAGE OF UTERUS     TONSILLECTOMY  age 73   TUBAL LIGATION     Patient Active Problem List   Diagnosis Date Noted   Stroke (HCC) 11/10/2021   Acute stroke of basal ganglia (HCC) 11/09/2021   Aphasia    ONSET DATE: 11/08/2021    REFERRING DIAG: stroke  THERAPY DIAG:  Aphasia  Cognitive communication deficit  Rationale for Evaluation and Treatment Rehabilitation  PERTINENT HISTORY: CHARMIKA MACDONNELL is a 81 y.o. female presenting with aphasia and dysarthria after referral from Dr. Doralee Albino. PMH is significant for hypertension, remotely hyperlipidemia and tobacco use. Patient presented with aphasia and dysarthria; after prolonged period of symptom on-start, (went to sleep after symptom onset, and  went to ED following morning). CT head showed no acute intracranial abnormalities.  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 Pt was admitted to [facility] from 11/09/2021 to 11/11/2021 and discharged home. Her two daughters are currently staying with her, however she was completely independent prior to this stroke.   SUBJECTIVE: "Just trying to get through it!"  PAIN:  Are you having pain? No   GOALS: Goals reviewed with patient? Yes   SHORT TERM GOALS: Target date: 01/04/2022    Pt will record 3 or greater weekly appointments, reminders, to-do items in her notebook/calendar to facilitate task completion and orientation. Baseline: Not using currently, 1/5 delayed recall, poor orientation   Goal status: ONGOING   2.  Pt will increase naming of low frequency objects/pictures to 90% acc when provided with min multimodality cues Baseline: 50% Goal status: ONGOING   3.  Pt will describe objects and pictures by providing at least three salient features as judged by clinician with 90% acc when provided min cues.  Baseline: 75% mod assist Goal status: ONGOING   4.  Pt will respond to open-ended questions using a complete sentence with 80% acc and min cues. Baseline: responds in short phrases Goal status: ONGOING   5.  Pt will implement speech intelligibility strategies during conversational speech (decrease rate, self correct errors, self monitoring, checking to see if listener understands) with min assist.  Baseline: mod assist Goal status: ONGOING   6.  Pt will implement word-finding strategies with 90% accuracy when unable to verbalize desired word in conversation/functional tasks with min assist. Baseline: 75% Goal status: ONGOING   LONG TERM GOALS: Target date: 02/01/2022  Pt will communicate moderate level wants/needs/thoughts to family and friends with use of multimodality communication strategies as needed. Baseline: mod assist Goal status:  INITIAL   2.  Pt will increase memory for functional tasks at home with use of compensatory strategies to [facility] with intermittent supervision. Baseline: full time supervision at this time Goal status: INITIAL OBJECTIVE:  OBJECTIVE IMPAIRMENTS include attention, memory, executive functioning, and expressive language. These impairments are limiting patient from managing medications, managing appointments, and effectively communicating at home and in community. Factors affecting potential to achieve goals and functional outcome are ability to learn/carryover information. Patient will benefit from skilled SLP services to address above impairments and improve overall function.   REHAB POTENTIAL: Good   PLAN: SLP FREQUENCY: 2x/week   SLP DURATION: 4 weeks   PLANNED INTERVENTIONS: Cueing hierachy, Internal/external aids, Functional tasks, Multimodal communication approach, and SLP instruction and feedback  TODAY'S TREATMENT: Pt was accompanied to therapy by her daughter, Lynden Ang. SLP provided skilled intervention to address language, speech, and cognitive communication deficits using response elaboration training, confrontation naming, written prompts, reinforcement, corrective feedback, and reassessment of needs with adjustments in session with caregiver present for treatment. They forgot to bring her notebook this date because they just arrived home from the beach. Both Vicky and Ms. Halberstadt indicated that communication with family went well, but there were times when she mixed up names of people. They stated that they did not update the calendar on the trip. Pt continues to have difficulty with orientation and following a calendar and required mod verbal and visual cues from SLP in session today. She completed a visual scanning task (letter cancellation) and she completed with 90% acc, but missed some items on the right side. She also completed a basic level word search with similar results. SLP provided a  visual cue down the right side of the page. Continue plan of care.    Thank you,  [name], CCC-SLP [phone]  Caretha Rumbaugh, CCC-SLP 619/2023, 10:28 AM

## 2021-12-22 ENCOUNTER — Ambulatory Visit (HOSPITAL_COMMUNITY): Payer: Medicare Other | Admitting: Speech Pathology

## 2021-12-22 ENCOUNTER — Encounter (HOSPITAL_COMMUNITY): Payer: Self-pay | Admitting: Speech Pathology

## 2021-12-22 DIAGNOSIS — R41841 Cognitive communication deficit: Secondary | ICD-10-CM | POA: Diagnosis not present

## 2021-12-22 DIAGNOSIS — R4701 Aphasia: Secondary | ICD-10-CM | POA: Diagnosis not present

## 2021-12-22 DIAGNOSIS — R29818 Other symptoms and signs involving the nervous system: Secondary | ICD-10-CM | POA: Diagnosis not present

## 2021-12-22 NOTE — Therapy (Signed)
OUTPATIENT SPEECH LANGUAGE PATHOLOGY TREATMENT NOTE   Patient Name: Janice [name] MRN: [id number] DOB:29-Oct-1940, 81 y.o., female Today's Date: 12/22/2021  PCP: Toma Deiters, MD  REFERRING PROVIDER: Moses Manners, MD   END OF SESSION:   End of Session - 12/22/21 1458     Visit Number 6   Number of Visits 9    Date for SLP Re-Evaluation 12/30/21    Authorization Type Medicare    SLP Start Time 0945   SLP Stop Time  1030   SLP Time Calculation (min) 45 min    Activity Tolerance Patient tolerated treatment well             Past Medical History:  Diagnosis Date   Anemia    History of blood transfusion    no reaction   Hypertension    Past Surgical History:  Procedure Laterality Date   CATARACT EXTRACTION W/PHACO  01/23/2012   Procedure: CATARACT EXTRACTION PHACO AND INTRAOCULAR LENS PLACEMENT (IOC);  Surgeon: Gemma Payor, MD;  Location: [facility];  Service: Ophthalmology;  Laterality: Right;  CDE 18.26   CATARACT EXTRACTION W/PHACO  02/13/2012   Procedure: CATARACT EXTRACTION PHACO AND INTRAOCULAR LENS PLACEMENT (IOC);  Surgeon: Gemma Payor, MD;  Location: [facility];  Service: Ophthalmology;  Laterality: Left;  CDE:14.14   CHOLECYSTECTOMY  1981   DILATION AND CURETTAGE OF UTERUS     TONSILLECTOMY  age 2   TUBAL LIGATION     Patient Active Problem List   Diagnosis Date Noted   Stroke (HCC) 11/10/2021   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 female presenting with aphasia and dysarthria after referral from Dr. Doralee Albino. PMH is significant for hypertension, remotely hyperlipidemia and tobacco use. Patient presented with aphasia and dysarthria; after prolonged period of symptom on-start, (went to sleep after symptom onset, and  went to ED following morning). CT head showed no acute intracranial abnormalities.  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 Pt was admitted to [facility] from 11/09/2021 to 11/11/2021 and discharged home. Her two daughters are currently staying with her, however she was completely independent prior to this stroke.   SUBJECTIVE: "June"  PAIN:  Are you having pain? No   GOALS: Goals reviewed with patient? Yes   SHORT TERM GOALS: Target date: 01/04/2022    Pt will record 3 or greater weekly appointments, reminders, to-do items in her notebook/calendar to facilitate task completion and orientation. Baseline: Not using currently, 1/5 delayed recall, poor orientation   Goal status: ONGOING   2.  Pt will increase naming of low frequency objects/pictures to 90% acc when provided with min multimodality cues Baseline: 50% Goal status: ONGOING   3.  Pt will describe objects and pictures by providing at least three salient features as judged by clinician with 90% acc when provided min cues.  Baseline: 75% mod assist Goal status: ONGOING   4.  Pt will respond to open-ended questions using a complete sentence with 80% acc and min cues. Baseline: responds in short phrases Goal status: ONGOING   5.  Pt will implement speech intelligibility strategies during conversational speech (decrease rate, self correct errors, self monitoring, checking to see if listener understands) with min assist. Baseline: mod assist Goal status:  ONGOING   6.  Pt will implement word-finding strategies with 90% accuracy when unable to verbalize desired word in conversation/functional tasks with min assist. Baseline: 75% Goal status: ONGOING   LONG TERM GOALS: Target date: 02/01/2022  Pt will communicate moderate level wants/needs/thoughts to family and friends with use of multimodality communication strategies as needed. Baseline: mod assist Goal status: INITIAL   2.  Pt will  increase memory for functional tasks at home with use of compensatory strategies to [facility] with intermittent supervision. Baseline: full time supervision at this time Goal status: INITIAL OBJECTIVE:  OBJECTIVE IMPAIRMENTS include attention, memory, executive functioning, and expressive language. These impairments are limiting patient from managing medications, managing appointments, and effectively communicating at home and in community. Factors affecting potential to achieve goals and functional outcome are ability to learn/carryover information. Patient will benefit from skilled SLP services to address above impairments and improve overall function.   REHAB POTENTIAL: Good   PLAN: SLP FREQUENCY: 2x/week   SLP DURATION: 4 weeks   PLANNED INTERVENTIONS: Cueing hierachy, Internal/external aids, Functional tasks, Multimodal communication approach, and SLP instruction and feedback  TODAY'S TREATMENT: Pt was accompanied to therapy by her daughter, Gavin Pound. SLP provided skilled intervention to address language, speech, and cognitive communication deficits using response elaboration training, confrontation naming, written prompts, reinforcement, corrective feedback, and reassessment of needs with adjustments in session with caregiver present for treatment. She completed visual scanning tasks for homework (letter cancellation) and word search with 100% acc. In session, she utilized the calendar to answer orientation questions with 100% acc with self correction prn. She named the categories of given words with 100% acc and added to the category with 100% acc when provided moderate cues. Continue plan of care.    Thank you,  [name], CCC-SLP [phone]  Breck Hollinger, CCC-SLP 619/2023, 10:40 AM

## 2021-12-23 ENCOUNTER — Encounter (HOSPITAL_COMMUNITY): Payer: Self-pay | Admitting: Occupational Therapy

## 2021-12-23 ENCOUNTER — Encounter (HOSPITAL_COMMUNITY): Payer: Self-pay

## 2021-12-23 ENCOUNTER — Ambulatory Visit (HOSPITAL_COMMUNITY): Payer: Medicare Other | Attending: Family Medicine

## 2021-12-23 ENCOUNTER — Encounter (HOSPITAL_BASED_OUTPATIENT_CLINIC_OR_DEPARTMENT_OTHER): Payer: Self-pay | Admitting: Cardiology

## 2021-12-23 ENCOUNTER — Ambulatory Visit (INDEPENDENT_AMBULATORY_CARE_PROVIDER_SITE_OTHER): Payer: Medicare Other | Admitting: Cardiology

## 2021-12-23 ENCOUNTER — Ambulatory Visit (HOSPITAL_COMMUNITY): Payer: Medicare Other | Admitting: Occupational Therapy

## 2021-12-23 VITALS — BP 156/94 | HR 68 | Ht 64.0 in | Wt 130.4 lb

## 2021-12-23 DIAGNOSIS — Z8673 Personal history of transient ischemic attack (TIA), and cerebral infarction without residual deficits: Secondary | ICD-10-CM

## 2021-12-23 DIAGNOSIS — Z7189 Other specified counseling: Secondary | ICD-10-CM

## 2021-12-23 DIAGNOSIS — R29818 Other symptoms and signs involving the nervous system: Secondary | ICD-10-CM

## 2021-12-23 DIAGNOSIS — M9272 Juvenile osteochondrosis of metatarsus, left foot: Secondary | ICD-10-CM | POA: Diagnosis not present

## 2021-12-23 DIAGNOSIS — E7800 Pure hypercholesterolemia, unspecified: Secondary | ICD-10-CM

## 2021-12-23 DIAGNOSIS — I639 Cerebral infarction, unspecified: Secondary | ICD-10-CM

## 2021-12-23 DIAGNOSIS — R41841 Cognitive communication deficit: Secondary | ICD-10-CM | POA: Diagnosis not present

## 2021-12-23 DIAGNOSIS — I10 Essential (primary) hypertension: Secondary | ICD-10-CM

## 2021-12-23 DIAGNOSIS — R4701 Aphasia: Secondary | ICD-10-CM | POA: Diagnosis not present

## 2021-12-23 MED ORDER — CHLORTHALIDONE 25 MG PO TABS: 25.0000 mg | ORAL_TABLET | Freq: Every day | ORAL | 3 refills | Status: DC

## 2021-12-23 NOTE — Patient Instructions (Signed)

## 2021-12-23 NOTE — Therapy (Signed)
OUTPATIENT OCCUPATIONAL THERAPY NEURO EVALUATION  Patient Name: Janice [name] MRN: [id number] DOB:04/06/1941, 81 y.o., female Today's Date: 12/23/2021  PCP: Stoney Bang, MD REFERRING PROVIDER: Madison Hickman, MD   OT End of Session - 12/23/21 1531     Visit Number 1    Number of Visits 1    Date for OT Re-Evaluation 12/24/21    Authorization Type Medicare A and B    Progress Note Due on Visit 10    OT Start Time 1353    OT Stop Time 1419    OT Time Calculation (min) 26 min    Activity Tolerance Patient tolerated treatment well    Behavior During Therapy WFL for tasks assessed/performed             Past Medical History:  Diagnosis Date   Anemia    History of blood transfusion    no reaction   Hypertension    Past Surgical History:  Procedure Laterality Date   CATARACT EXTRACTION W/PHACO  01/23/2012   Procedure: CATARACT EXTRACTION PHACO AND INTRAOCULAR LENS PLACEMENT ([place]);  Surgeon: Tonny Branch, MD;  Location: [facility];  Service: Ophthalmology;  Laterality: Right;  CDE 18.26   CATARACT EXTRACTION W/PHACO  02/13/2012   Procedure: CATARACT EXTRACTION PHACO AND INTRAOCULAR LENS PLACEMENT ([facility]);  Surgeon: Tonny Branch, MD;  Location: [facility];  Service: Ophthalmology;  Laterality: Left;  CDE:14.14   CHOLECYSTECTOMY  1981   DILATION AND CURETTAGE OF UTERUS     TONSILLECTOMY  age 66   TUBAL LIGATION     Patient Active Problem List   Diagnosis Date Noted   Stroke ([place]) 11/10/2021   Acute stroke of basal ganglia ([place]) 11/09/2021   Aphasia     ONSET DATE: 11/08/21  REFERRING DIAG: s/p left CVA  THERAPY DIAG:  Other symptoms and signs involving the nervous system  Rationale for Evaluation and Treatment Rehabilitation  SUBJECTIVE:   SUBJECTIVE STATEMENT: S: I am doing well. I can do what I need to,  Pt accompanied by: family member  PERTINENT HISTORY: Janice [name] is a 81 y.o. female presenting with a referral from Dr. Madison Hickman. PMH is significant for  hypertension, remotely hyperlipidemia and tobacco use. Patient presented with aphasia and dysarthria; after prolonged period of symptom on-start, (went to sleep after symptom onset, and went to ED following morning). CT head showed no acute intracranial abnormalities.  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 Pt was [facility] to [facility] from 11/09/2021 to 11/11/2021 and discharged home. Her two daughters are currently staying with her, however she was completely independent prior to this stroke.  PRECAUTIONS: None  WEIGHT BEARING RESTRICTIONS No  PAIN:  Are you having pain? No  FALLS: Has patient fallen in last 6 months? No  LIVING ENVIRONMENT: Lives with: lives alone Lives in: House/apartment Stairs: Yes: External: 4 steps; can reach both Has following equipment at home: None  PLOF: Independent  PATIENT GOALS I'd like to be able to mow the lawn and write my name better.  OBJECTIVE:   HAND DOMINANCE: Right  ADLs:Writing has been a little difficulty, but getting better. She was able to write checks to pay her bills yesterday. It requires increased time to complete buttons. Independent with all ADLs      FUNCTIONAL OUTCOME MEASURES: Quick Dash: 9.09  UPPER EXTREMITY ROM     Active ROM Right eval Left eval  Shoulder flexion  [facility]  Shoulder abduction  [facility]  Shoulder internal rotation  WFL  Shoulder external rotation  WFL  Elbow flexion  WFL  Elbow extension  WFL  Wrist flexion  WFL  Wrist extension  WFL  (Blank rows = not tested)   UPPER EXTREMITY MMT:     MMT Right eval Left eval  Shoulder flexion 4+/5 4+/5  Shoulder abduction 4+/5 4+/5  Shoulder internal rotation 4+/5 4+/5  Shoulder external rotation 4+/5 4+/5  Elbow flexion 5/5 5/5  Elbow extension 5/5 5/5  (Blank rows = not tested)  HAND FUNCTION: Grip strength: Right: 30 lbs; Left: 40 lbs, Lateral pinch: Right: 8 lbs, Left: 12 lbs, and 3 point pinch: Right: 8 lbs,  Left: 14 lbs  COORDINATION: 9 Hole Peg test: Right: 27.5 sec; Left: 28 sec  SENSATION: WFL    COGNITION: Overall cognitive status: Within functional limits for tasks assessed   VISION ASSESSMENT: WFL     PATIENT EDUCATION: Education details: TheraPutty hand strengthening Person educated: Patient Education method: Explanation, Demonstration, and Handouts Education comprehension: verbalized understanding and returned demonstration   HOME EXERCISE PROGRAM: Eval: Theraputty hand strengthening    GOALS: Goals reviewed with patient? Yes  SHORT TERM GOALS: Target date: 12/23/21  Pt will be education on HEP to improve right hand strength in order to complete IADLs with less difficulty.  Baseline: Goal status: MET   ASSESSMENT:  CLINICAL IMPRESSION: Patient is a 81 y.o. female who was seen today for occupational therapy evaluation following a L CVA on 11/28/21. She presents with UE ROM, strength and coordination within functional limits. Pt is completing ADLs independently at home and does have family staying with her for the next couple of weeks. Pt reported that she has seen improvements in her ability to sign her name and write checks. She expressed some difficulty with making her morning coffee when the kitchen is dark. Therapist providing education on environmental modifications such as ensuring the lights are in the kitchen.  PERFORMANCE DEFICITS in functional skills including IADLs,  IMPAIRMENTS are limiting patient from IADLs.   COMORBIDITIES has no other co-morbidities that affects occupational performance. Patient will benefit from skilled OT to address above impairments and improve overall function.  MODIFICATION OR ASSISTANCE TO COMPLETE EVALUATION: No modification of tasks or assist necessary to complete an evaluation.  OT OCCUPATIONAL PROFILE AND HISTORY: Problem focused assessment: Including review of records relating to presenting problem.  CLINICAL  DECISION MAKING: LOW - limited treatment options, no task modification necessary  REHAB POTENTIAL: Good  EVALUATION COMPLEXITY: Low    PLAN: OT FREQUENCY: one time visit  OT DURATION: other: Evaluation only   PLANNED INTERVENTIONS: patient/family education   CONSULTED AND AGREED WITH PLAN OF CARE: Patient and family member/caregiver  PLAN FOR NEXT SESSION: No further OT services needed    Guadelupe Sabin, OTR/L  [phone]  12/23/2021, 3:35 PM

## 2021-12-23 NOTE — Patient Instructions (Signed)
Medication Instructions:  START: Chlorthalidone 25 mg daily  *If you need a refill on your cardiac medications before your next appointment, please call your pharmacy*   Lab Work: Please have your primary physician check BMP    Testing/Procedures: None ordered today   Follow-Up: At [facility], you and your health needs are our priority.  As part of our continuing mission to provide you with exceptional heart care, we have created designated Provider Care Teams.  These Care Teams include your primary Cardiologist (physician) and Advanced Practice Providers (APPs -  Physician Assistants and Nurse Practitioners) who all work together to provide you with the care you need, when you need it.  We recommend signing up for the patient portal called "MyChart".  Sign up information is provided on this After Visit Summary.  MyChart is used to connect with patients for Virtual Visits (Telemedicine).  Patients are able to view lab/test results, encounter notes, upcoming appointments, etc.  Non-urgent messages can be sent to your provider as well.   To learn more about what you can do with MyChart, go to [email or web address].    Your next appointment:   6 week(s)  The format for your next appointment:   In Person  Provider:   Jodelle Red, MD{

## 2021-12-23 NOTE — Progress Notes (Signed)
Cardiology Office Note:    Date:  12/23/2021   ID:  Janice [name], DOB [birth year], MRN [id number]  PCP:  Neale Burly, MD  Cardiologist:  Buford Dresser, MD  Referring MD: Neale Burly, MD   CC: new patient evaluation for history of stroke  History of Present Illness:    Janice [name] is a 81 y.o. female with a hx of CVA, hypertension, hypercholesterolemia who is seen as a new consult at the request of Hasanaj, Samul Dada, MD for the evaluation and management of CVA history, rule out embolic etiology.  Admitted 11/08/21 with a CVA,   Here with her two daughters today, still wearing 30 day monitor. PCP is in [place].  Checks BP at home, was at the beach last week but has numbers from prior. Range 148/63-205/94. Most ~160s/80. Pulse 62.  Cardiovascular risk factors: Prior clinical ASCVD: CVA Comorbid conditions: hypertension--for many years (decades). Had issues with control a few months before the stroke, had changed medications. Took a medication one time, heart started skipping. They think this may have been benazepril. Remote issue with amlodipine (swelling). Recently started on atorvastatin for hyperlipidemia. No diabetes, chronic kidney disease  Metabolic syndrome/Obesity: BMI 22 Chronic inflammatory conditions: none Tobacco use history: very remotely/briefly Family history: no history of heart disease or stroke Prior pertinent testing and/or incidental findings: echo reviewed.  Denies chest pain, shortness of breath at rest or with normal exertion. No PND, orthopnea, LE edema or unexpected weight gain. No syncope or palpitations.   Past Medical History:  Diagnosis Date   Anemia    History of blood transfusion    no reaction   Hypertension     Past Surgical History:  Procedure Laterality Date   CATARACT EXTRACTION W/PHACO  01/23/2012   Procedure: CATARACT EXTRACTION PHACO AND INTRAOCULAR LENS PLACEMENT ([place]);  Surgeon: Tonny Branch, MD;  Location: [facility];   Service: Ophthalmology;  Laterality: Right;  CDE 18.26   CATARACT EXTRACTION W/PHACO  02/13/2012   Procedure: CATARACT EXTRACTION PHACO AND INTRAOCULAR LENS PLACEMENT ([facility]);  Surgeon: Tonny Branch, MD;  Location: [facility];  Service: Ophthalmology;  Laterality: Left;  CDE:14.14   CHOLECYSTECTOMY  1981   DILATION AND CURETTAGE OF UTERUS     TONSILLECTOMY  age 55   TUBAL LIGATION      Current Medications: Current Outpatient Medications on File Prior to Visit  Medication Sig   aspirin 81 MG EC tablet Take 1 tablet (81 mg total) by mouth daily. Swallow whole.   atorvastatin (LIPITOR) 80 MG tablet Take 1 tablet (80 mg total) by mouth daily.   clopidogrel (PLAVIX) 75 MG tablet Take 1 tablet (75 mg total) by mouth daily.   doxazosin (CARDURA) 2 MG tablet Take 1 tablet (2 mg total) by mouth at bedtime.   losartan (COZAAR) 100 MG tablet Take 100 mg by mouth daily.   No current facility-administered medications on file prior to visit.     Allergies:   Norvasc [amlodipine]   Social History   Tobacco Use   Smoking status: Never  Substance Use Topics   Alcohol use: No   Drug use: No    Family History: No family history of heart disease or stroke  ROS:   Please see the history of present illness.  Additional pertinent ROS: Constitutional: Negative for chills, fever, night sweats, unintentional weight loss  HENT: Negative for ear pain and hearing loss.   Eyes: Negative for loss of vision and eye pain.  Respiratory: Negative for  cough, sputum, wheezing.   Cardiovascular: See HPI. Gastrointestinal: Negative for abdominal pain, melena, and hematochezia.  Genitourinary: Negative for dysuria and hematuria.  Musculoskeletal: Negative for falls and myalgias.  Skin: Negative for itching and rash.  Neurological: Negative for focal weakness, focal sensory changes and loss of consciousness.  Endo/Heme/Allergies: Does not bruise/bleed easily.     EKGs/Labs/Other Studies Reviewed:    The following  studies were reviewed today: Echo 11/09/21 1. Left ventricular ejection fraction, by estimation, is 55 to 60%. The  left ventricle has normal function. The left ventricle has no regional  wall motion abnormalities. Left ventricular diastolic parameters are  consistent with Grade II diastolic  dysfunction (pseudonormalization).   2. Right ventricular systolic function is normal. The right ventricular  size is normal. There is normal pulmonary artery systolic pressure. The  estimated right ventricular systolic pressure is 30.9 mmHg.   3. Left atrial size was moderately dilated.   4. The mitral valve is grossly normal. Mild mitral valve regurgitation.  No evidence of mitral stenosis.   5. The aortic valve is tricuspid. Aortic valve regurgitation is not  visualized. No aortic stenosis is present.   6. The inferior vena cava is normal in size with greater than 50%  respiratory variability, suggesting right atrial pressure of 3 mmHg.  EKG:  EKG is personally reviewed.   12/23/21 Sinus rhythm with sinus arrhythmia at 68 bpm, borderline LVH  Recent Labs: 11/08/2021: Hemoglobin 13.8; Platelets 192 11/09/2021: ALT 16; TSH 4.766 11/10/2021: BUN 12; Creatinine, Ser 1.00; Potassium 3.5; Sodium 139  Recent Lipid Panel    Component Value Date/Time   CHOL 207 (H) 11/09/2021 0510   TRIG 71 11/09/2021 0510   HDL 52 11/09/2021 0510   CHOLHDL 4.0 11/09/2021 0510   VLDL 14 11/09/2021 0510   LDLCALC 141 (H) 11/09/2021 0510    Physical Exam:    VS:  BP (!) 156/94 (BP Location: Left Arm, Patient Position: Sitting, Cuff Size: Normal)   Pulse 68   Ht 5\' 4"  (1.626 m)   Wt 130 lb 6.4 oz (59.1 kg)   BMI 22.38 kg/m     Wt Readings from Last 3 Encounters:  12/23/21 130 lb 6.4 oz (59.1 kg)  11/09/21 136 lb 14.5 oz (62.1 kg)  01/19/12 137 lb (62.1 kg)    GEN: Well nourished, well developed in no acute distress HEENT: Normal, moist mucous membranes NECK: No JVD CARDIAC: regular rhythm, normal S1 and S2,  no rubs or gallops. No murmur. VASCULAR: Radial and DP pulses 2+ bilaterally. No carotid bruits RESPIRATORY:  Clear to auscultation without rales, wheezing or rhonchi  ABDOMEN: Soft, non-tender, non-distended MUSCULOSKELETAL:  Ambulates independently SKIN: Warm and dry, no edema NEUROLOGIC:  Alert and oriented x 3. No focal neuro deficits noted. PSYCHIATRIC:  Normal affect    ASSESSMENT:    1. History of CVA (cerebrovascular accident)   2. Primary hypertension   3. Pure hypercholesterolemia   4. Cardiac risk counseling   5. Counseling on health promotion and disease prevention    PLAN:    History of CVA Hypercholesterolemia -still currently wearing 30 day monitor to evaluate for afib -followed by neurology -continue aspirin, clopidogrel as directed by neurology -continue atorvastatin  Hypertension -elevated today, despite losartan and doxazosin -will start chlorthalidone -check BMET at follow up  Cardiac risk counseling and prevention recommendations: -recommend heart healthy/Mediterranean diet, with whole grains, fruits, vegetable, fish, lean meats, nuts, and olive oil. Limit salt. -recommend moderate walking, 3-5 times/week for 30-50 minutes  each session. Aim for at least 150 minutes.week. Goal should be pace of 3 miles/hours, or walking 1.5 miles in 30 minutes -recommend avoidance of tobacco products. Avoid excess alcohol.  Plan for follow up: 6 weeks  Jodelle Red, MD, PhD, [facility]  [facility]    Medication Adjustments/Labs and Tests Ordered: Current medicines are reviewed at length with the patient today.  Concerns regarding medicines are outlined above.  Orders Placed This Encounter  Procedures   EKG 12-Lead   Meds ordered this encounter  Medications   DISCONTD: chlorthalidone (HYGROTON) 25 MG tablet    Sig: Take 1 tablet (25 mg total) by mouth daily.    Dispense:  90 tablet    Refill:  3    Patient Instructions  Medication  Instructions:  START: Chlorthalidone 25 mg daily  *If you need a refill on your cardiac medications before your next appointment, please call your pharmacy*   Lab Work: Please have your primary physician check BMP    Testing/Procedures: None ordered today   Follow-Up: At [facility], you and your health needs are our priority.  As part of our continuing mission to provide you with exceptional heart care, we have created designated Provider Care Teams.  These Care Teams include your primary Cardiologist (physician) and Advanced Practice Providers (APPs -  Physician Assistants and Nurse Practitioners) who all work together to provide you with the care you need, when you need it.  We recommend signing up for the patient portal called "MyChart".  Sign up information is provided on this After Visit Summary.  MyChart is used to connect with patients for Virtual Visits (Telemedicine).  Patients are able to view lab/test results, encounter notes, upcoming appointments, etc.  Non-urgent messages can be sent to your provider as well.   To learn more about what you can do with MyChart, go to [email or web address].    Your next appointment:   6 week(s)  The format for your next appointment:   In Person  Provider:   Jodelle Red, MD{         Signed, Jodelle Red, MD PhD 12/23/2021     [facility]

## 2021-12-23 NOTE — Therapy (Signed)
OUTPATIENT PHYSICAL THERAPY NEURO EVALUATION   Patient Name: Janice [name] MRN: [id number] DOB:1941-01-31, 81 y.o., female Today's Date: 12/23/2021   PCP: Neale Burly MD REFERRING PROVIDER: Zenia Resides MD   PT End of Session - 12/23/21 1432     Visit Number 1    Authorization Type Medicare part a And *    PT Start Time [id number]    PT Stop Time 1515    PT Time Calculation (min) 40 min             Past Medical History:  Diagnosis Date   Anemia    History of blood transfusion    no reaction   Hypertension    Past Surgical History:  Procedure Laterality Date   CATARACT EXTRACTION W/PHACO  01/23/2012   Procedure: CATARACT EXTRACTION PHACO AND INTRAOCULAR LENS PLACEMENT ([place]);  Surgeon: Tonny Branch, MD;  Location: [facility];  Service: Ophthalmology;  Laterality: Right;  CDE 18.26   CATARACT EXTRACTION W/PHACO  02/13/2012   Procedure: CATARACT EXTRACTION PHACO AND INTRAOCULAR LENS PLACEMENT ([facility]);  Surgeon: Tonny Branch, MD;  Location: [facility];  Service: Ophthalmology;  Laterality: Left;  CDE:14.14   CHOLECYSTECTOMY  1981   DILATION AND CURETTAGE OF UTERUS     TONSILLECTOMY  age 62   TUBAL LIGATION     Patient Active Problem List   Diagnosis Date Noted   Stroke ([place]) 11/10/2021   Acute stroke of basal ganglia ([place]) 11/09/2021   Aphasia     ONSET DATE: 11/09/21  REFERRING DIAG: I63.9 (ICD-10-CM) - Acute stroke of basal ganglia (HCC)   THERAPY DIAG:  Infarction of left basal ganglia (HCC)  Rationale for Evaluation and Treatment Rehabilitation  SUBJECTIVE:                                                                                                                                                                                              SUBJECTIVE STATEMENT: Pt experienced CVA 11/09/21. MRI: patchy acute ischemic nonhemorrhagic left basal ganglia infarct. Pt with no home care services. Daughters are currently taking turns staying with mother at this time. Patient  to follow up with cardiology on July 22. Patient is wearing halter monitor for 1 months, will be done on June 30th. Daughter reports that speech and trying to coordinate patterns to adls, for example turnign on lights before entering room, turning on tv correctly are what is most troublesome. Otherwise daughter feels that patient is doing well overall and is moving better than she had been. Pt accompanied by: family member  PERTINENT HISTORY:  ARIEYANNA [name] is a 81 y.o. female  presenting with aphasia and dysarthria after referral from Dr. Doralee Albino. PMH is significant for hypertension, remotely hyperlipidemia and tobacco use. Patient presented with aphasia and dysarthria; after prolonged period of symptom on-start, (went to sleep after symptom onset, and went to ED following morning). CT head showed no acute intracranial abnormalities.  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 Pt was admitted to [facility] from 11/09/2021 to 11/11/2021 and discharged home. Her two daughters are currently staying with her, however she was completely independent prior to this stroke.   PAIN:  Are you having pain? No  PRECAUTIONS: None  WEIGHT BEARING RESTRICTIONS No  FALLS: Has patient fallen in last 6 months? No  LIVING ENVIRONMENT: Lives with: lives with their family Lives in: House/apartment Stairs: Yes: External: 3 steps; on right going up Has following equipment at home: Walker - 2 wheeled, shower chair, and bed side commode  PLOF:  Level of assistance: Independent with ADLs Employment: Retired    PATIENT GOALS : daughter reports initially some weakness on right side. Wants to be checked out to see how she is doing at this time.   OBJECTIVE:   DIAGNOSTIC FINDINGS:  IMPRESSION: Evolving acute left basal ganglia infarct with increased edema. Mild effacement of the frontal horn of the left lateral ventricle without midline shift. No acute hemorrhage.     COGNITION: Overall cognitive status:  expressive aphasia, follows simple commands.    SENSATION: WFL  COORDINATION: Finger to nose Finger to finger Alternating UE  Heel to shin Toe tapping  All above intact   POSTURE: rounded shoulders   LOWER EXTREMITY MMT:    MMT Right Eval Left Eval  Hip flexion 5 5  Hip extension 5 (single leg bridge) 5 (single leg bridge)  Hip abduction 4 5  Hip adduction 5 5  Hip internal rotation    Hip external rotation    Knee flexion 5 5  Knee extension 5 5  Ankle dorsiflexion 5 5  Ankle plantarflexion    Ankle inversion 5 5  Ankle eversion 5 5  (Blank rows = not tested)  BED MOBILITY:  Independent in all   TRANSFERS: Assistive device utilized: None  Sit to stand: Complete Independence Stand to sit: Complete Independence Chair to chair: Complete Independence No UE support needed    STAIRS:  Level of Assistance: Complete Independence  Stair Negotiation Technique: Alternating Pattern  with Single Rail on Right  Number of Stairs: 10   Height of Stairs: 6 and 8 inch    GAIT: Gait pattern: WFL Distance walked: 368 feet on 2 minute walk  Assistive device utilized: None Level of assistance: Complete Independence  DGI 1. Gait level surface Instructions: Walk at your normal speed from here to the next mark (20') 2. Change in gait speed (3) Normal: Able to smoothly change walking speed without loss of balance or gait deviation. Shows a significant difference in walking speeds between normal, fast and slow speeds. 3. Gait with horizontal head turns (3) Normal: Performs head turns smoothly with no change in gait. 4. Gait with vertical head turns (3) Normal: Performs head turns smoothly with no change in gait. 5. Gait and pivot turn (3) Normal: Pivot turns safely within 3 seconds and stops quickly with no loss of balance. 6. Step over obstacle (3) Normal: Is able to step over the box without changing gait speed, no evidence  of imbalance. 7. Step around obstacles (3) Normal: Is able to walk around cones safely without  changing gait speed; no evidence of imbalance. 8. Stairs (2) Mild Impairment: Alternating feet, must use rail.  TOTAL SCORE: 23 / 24   Balance Sls aprox 7 seconds b/l with multi attempts Tandem with left forward 15 seconds   Unable to tandem with right LE forward    PATIENT SURVEYS:  FOTO 79  TODAY'S TREATMENT:  Evaluation    PATIENT EDUCATION:  Education details: Patient educated on exam findings Person educated: Patient Education method: Explanation Education comprehension: verbalized understanding      GOALS: Goals not appropriate at this time.  ASSESSMENT:  CLINICAL IMPRESSION: Patient a 81 y.o. female who was seen today for physical therapy evaluation and treatment for CVA.  Pt experienced CVA to left basal ganglia on 11/09/21. Patient presents with expressive aphasia. Daughter present and reports that overall her other is doing well with the exception of small things around home such as remembering to turn on light when entering room as well as speech difficulties. There are no reported issues with ambulation and balance during adls. On testing today pt has minor balance deficit in narrow base of tandem stance. Pt can not hold stance with right foot forward but shows good stepping strategy out of position to regain balance. Patient ambulates independent,scores 23/24 on DGI, transfers no UE needed and performs stairs in reciprocal manner with uni UE. At this time patient is not recommended for skilled PT services and is instructed to continue with daily exercise and follow instructions regarding exercise from cardiology, pending results of halter monitor wear.    OBJECTIVE IMPAIRMENTS  expressive aphasia and slight decrease in narrow base balance .   ACTIVITY LIMITATIONS  communication  PARTICIPATION LIMITATIONS:  none  PERSONAL FACTORS Age and High BP, visual impairments  (cataract)   are also affecting patient's functional outcome.   REHAB POTENTIAL: not recommending PT  CLINICAL DECISION MAKING: Stable/uncomplicated  EVALUATION COMPLEXITY: Low  PLAN: PT FREQUENCY: one time visit  PT DURATION: other: eval only   PLANNED INTERVENTIONS: not recommending PT PLAN FOR NEXT SESSION: not recommending PT   Eivan Gallina, PT 12/23/2021, 5:49 PM

## 2021-12-24 ENCOUNTER — Telehealth: Payer: Self-pay | Admitting: Cardiology

## 2021-12-24 DIAGNOSIS — I10 Essential (primary) hypertension: Secondary | ICD-10-CM

## 2021-12-24 MED ORDER — CHLORTHALIDONE 25 MG PO TABS: 12.5000 mg | ORAL_TABLET | Freq: Every day | ORAL | 3 refills | Status: DC

## 2021-12-24 NOTE — Telephone Encounter (Signed)
Pt c/o BP issue: STAT if pt c/o blurred vision, one-sided weakness or slurred speech  1. What are your last 5 BP readings? 156/94; 68; 117/71; 74; 129/54; 75  2. Are you having any other symptoms (ex. Dizziness, headache, blurred vision, passed out)? Pre-syncope, slurred speech, lightheadedness  3. What is your BP issue? Drastic drop in BP

## 2021-12-24 NOTE — Telephone Encounter (Signed)
Spoke with patient and her daughter.  Went to [employer] one hour ago - came back standing in yard started to feel lightheaded, near syncopal. Blood pressure 117/71 which is lower than her usual. Started Chlorthalidone this morning after clinic visit yesterday.   Today had sandwich prior to shopping trip. Purchased a mildshake but had not drank yet. Not much else to eat or drink today.   Discussed likely etiology low PO intake, new medication.   She will hold Doxazosin this evening. Encouraged to eat and drink something. Reduce Chlorthalidone to half tablet daily.Continue Losartan 100mg  daily. Asked her daughter to update [place] early next week with report of blood pressures.   Alver Sorrow, NP

## 2021-12-27 ENCOUNTER — Encounter (HOSPITAL_COMMUNITY): Payer: Self-pay | Admitting: Speech Pathology

## 2021-12-27 ENCOUNTER — Ambulatory Visit (HOSPITAL_COMMUNITY): Payer: Medicare Other | Admitting: Speech Pathology

## 2021-12-27 DIAGNOSIS — R4701 Aphasia: Secondary | ICD-10-CM

## 2021-12-27 DIAGNOSIS — R41841 Cognitive communication deficit: Secondary | ICD-10-CM | POA: Diagnosis not present

## 2021-12-27 DIAGNOSIS — R29818 Other symptoms and signs involving the nervous system: Secondary | ICD-10-CM | POA: Diagnosis not present

## 2021-12-27 NOTE — Therapy (Signed)
OUTPATIENT SPEECH LANGUAGE PATHOLOGY TREATMENT NOTE   Patient Name: Janice [name] MRN: [id number] DOB:Sep 21, 1940, 81 y.o., female Today's Date: 12/27/2021  PCP: Janice Deiters, MD  REFERRING PROVIDER: Moses Manners, MD   END OF SESSION:   End of Session - 12/22/21 1458     Visit Number 7   Number of Visits 9    Date for SLP Re-Evaluation 12/30/21    Authorization Type Medicare    SLP Start Time 0945   SLP Stop Time  1030   SLP Time Calculation (min) 45 min    Activity Tolerance Patient tolerated treatment well             Past Medical History:  Diagnosis Date   Anemia    History of blood transfusion    no reaction   Hypertension    Past Surgical History:  Procedure Laterality Date   CATARACT EXTRACTION W/PHACO  01/23/2012   Procedure: CATARACT EXTRACTION PHACO AND INTRAOCULAR LENS PLACEMENT ([facility]);  Surgeon: Gemma Payor, MD;  Location: [facility];  Service: Ophthalmology;  Laterality: Right;  CDE 18.26   CATARACT EXTRACTION W/PHACO  02/13/2012   Procedure: CATARACT EXTRACTION PHACO AND INTRAOCULAR LENS PLACEMENT ([facility]);  Surgeon: Gemma Payor, MD;  Location: [facility];  Service: Ophthalmology;  Laterality: Left;  CDE:14.14   CHOLECYSTECTOMY  1981   DILATION AND CURETTAGE OF UTERUS     TONSILLECTOMY  age 68   TUBAL LIGATION     Patient Active Problem List   Diagnosis Date Noted   Stroke (HCC) 11/10/2021   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 female presenting with aphasia and dysarthria after referral from Dr. Doralee [name]. PMH is significant for hypertension, remotely hyperlipidemia and tobacco use. Patient presented with aphasia and dysarthria; after prolonged period of symptom on-start, (went to sleep after symptom onset, and  went to ED following morning). CT head showed no acute intracranial abnormalities.  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 Pt was admitted to [facility] from 11/09/2021 to 11/11/2021 and discharged home. Her two daughters are currently staying with her, however she was completely independent prior to this stroke.   SUBJECTIVE: "Janice [name]"  PAIN:  Are you having pain? No   GOALS: Goals reviewed with patient? Yes   SHORT TERM GOALS: Target date: 01/04/2022    Pt will record 3 or greater weekly appointments, reminders, to-do items in her notebook/calendar to facilitate task completion and orientation. Baseline: Not using currently, 1/5 delayed recall, poor orientation   Goal status: ONGOING   2.  Pt will increase naming of low frequency objects/pictures to 90% acc when provided with min multimodality cues Baseline: 50% Goal status: ONGOING   3.  Pt will describe objects and pictures by providing at least three salient features as judged by clinician with 90% acc when provided min cues.  Baseline: 75% mod assist Goal status: ONGOING   4.  Pt will respond to open-ended questions using a complete sentence with 80% acc and min cues. Baseline: responds in short phrases Goal status: ONGOING   5.  Pt will implement speech intelligibility strategies during conversational speech (decrease rate, self correct errors, self monitoring, checking to see if listener understands) with min assist. Baseline: mod assist Goal status:  ONGOING   6.  Pt will implement word-finding strategies with 90% accuracy when unable to verbalize desired word in conversation/functional tasks with min assist. Baseline: 75% Goal status: ONGOING   LONG TERM GOALS: Target date: 02/01/2022  Pt will communicate moderate level wants/needs/thoughts to family and friends with use of multimodality communication strategies as needed. Baseline: mod assist Goal status: INITIAL   2.  Pt will  increase memory for functional tasks at home with use of compensatory strategies to [facility] with intermittent supervision. Baseline: full time supervision at this time Goal status: INITIAL OBJECTIVE:  OBJECTIVE IMPAIRMENTS include attention, memory, executive functioning, and expressive language. These impairments are limiting patient from managing medications, managing appointments, and effectively communicating at home and in community. Factors affecting potential to achieve goals and functional outcome are ability to learn/carryover information. Patient will benefit from skilled SLP services to address above impairments and improve overall function.   REHAB POTENTIAL: Good   PLAN: SLP FREQUENCY: 2x/week   SLP DURATION: 4 weeks   PLANNED INTERVENTIONS: Cueing hierachy, Internal/external aids, Functional tasks, Multimodal communication approach, and SLP instruction and feedback  TODAY'S TREATMENT: Pt was accompanied to therapy by her daughter, Janice [name]. SLP provided skilled intervention to address language, speech, and cognitive communication deficits using written prompts, reinforcement, corrective feedback, and reassessment of needs with adjustments in session with caregiver present for treatment. She completed concrete and abstract naming tasks for homework with assist from her family. In session, she utilized the calendar to answer orientation questions with 100% acc. She stayed in her home alone for 4 hours and expressed that she enjoyed the independence. She indicated that she wants to be able to stay over night by herself. She assisted in problem solving safety concerns and verbalized that she has lights on in the bathroom and no rugs to trip over at night. She stated that she thought about going down the steps to her backyard, but decided that her safety was more important and did not attempt the task. SLP showed Pt how to access phone numbers and texts on her phone and she sent a video and photo  to her grandson. Pt tends to have difficulty with a task initially, but with repetition, improves tremendously. Next session, review goals and continue with phone assist to increase safety at home. Continue plan of care.    Thank you,  [name], CCC-SLP [phone]  Maddix Kliewer, CCC-SLP 12/27/2021, 12:18 PM

## 2021-12-29 ENCOUNTER — Ambulatory Visit (HOSPITAL_COMMUNITY): Payer: Medicare Other | Admitting: Speech Pathology

## 2021-12-29 DIAGNOSIS — R4701 Aphasia: Secondary | ICD-10-CM

## 2021-12-29 DIAGNOSIS — R41841 Cognitive communication deficit: Secondary | ICD-10-CM

## 2021-12-29 DIAGNOSIS — R29818 Other symptoms and signs involving the nervous system: Secondary | ICD-10-CM | POA: Diagnosis not present

## 2021-12-29 NOTE — Therapy (Signed)
OUTPATIENT SPEECH LANGUAGE PATHOLOGY TREATMENT NOTE   Patient Name: Janice [name] MRN: [id number] DOB:17-Dec-1940, 81 y.o., female Today's Date: 12/29/2021  PCP: Neale Burly, MD  REFERRING PROVIDER: Zenia Resides, MD   END OF SESSION:   End of Session - 12/22/21 1458     Visit Number 8   Number of Visits 9    Date for SLP Re-Evaluation 12/30/21    Authorization Type Medicare    SLP Start Time 0945   SLP Stop Time  1030   SLP Time Calculation (min) 45 min    Activity Tolerance Patient tolerated treatment well             Past Medical History:  Diagnosis Date   Anemia    History of blood transfusion    no reaction   Hypertension    Past Surgical History:  Procedure Laterality Date   CATARACT EXTRACTION W/PHACO  01/23/2012   Procedure: CATARACT EXTRACTION PHACO AND INTRAOCULAR LENS PLACEMENT ([place]);  Surgeon: Tonny Branch, MD;  Location: [facility];  Service: Ophthalmology;  Laterality: Right;  CDE 18.26   CATARACT EXTRACTION W/PHACO  02/13/2012   Procedure: CATARACT EXTRACTION PHACO AND INTRAOCULAR LENS PLACEMENT ([facility]);  Surgeon: Tonny Branch, MD;  Location: [facility];  Service: Ophthalmology;  Laterality: Left;  CDE:14.14   CHOLECYSTECTOMY  1981   DILATION AND CURETTAGE OF UTERUS     TONSILLECTOMY  age 42   TUBAL LIGATION     Patient Active Problem List   Diagnosis Date Noted   Stroke ([place]) 11/10/2021   Acute stroke of basal ganglia ([place]) 11/09/2021   Aphasia    ONSET DATE: 11/08/2021    REFERRING DIAG: stroke  THERAPY DIAG:  Aphasia  Cognitive communication deficit  Rationale for Evaluation and Treatment Rehabilitation  PERTINENT HISTORY: Janice [name] is a 81 y.o. female presenting with aphasia and dysarthria after referral from Dr. Madison Hickman. PMH is significant for hypertension, remotely hyperlipidemia and tobacco use. Patient presented with aphasia and dysarthria; after prolonged period of symptom on-start, (went to sleep after symptom onset, and  went to ED following morning). CT head showed no acute intracranial abnormalities.  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 Pt was admitted to [facility] from 11/09/2021 to 11/11/2021 and discharged home. Her two daughters are currently staying with her, however she was completely independent prior to this stroke.   SUBJECTIVE: "Janice [name], carrot, piano, green"  PAIN:  Are you having pain? No   GOALS: Goals reviewed with patient? Yes   SHORT TERM GOALS: Target date: 01/04/2022    Pt will record 3 or greater weekly appointments, reminders, to-do items in her notebook/calendar to facilitate task completion and orientation. Baseline: Not using currently, 1/5 delayed recall, poor orientation   Goal status: MET   2.  Pt will increase naming of low frequency objects/pictures to 90% acc when provided with min multimodality cues Baseline: 50% Goal status: MET   3.  Pt will describe objects and pictures by providing at least three salient features as judged by clinician with 90% acc when provided min cues.  Baseline: 75% mod assist Goal status: MET   4.  Pt will respond to open-ended questions using a complete sentence with 80% acc and min cues. Baseline: responds in short phrases Goal status: MET   5.  Pt will implement speech intelligibility strategies during conversational speech (decrease rate, self correct errors, self monitoring, checking to see if listener understands) with min assist. Baseline: mod  assist Goal status: MET   6.  Pt will implement word-finding strategies with 90% accuracy when unable to verbalize desired word in conversation/functional tasks with min assist. Baseline: 75% Goal status: MET   LONG TERM GOALS: Target date: 02/01/2022  Pt will communicate moderate level wants/needs/thoughts to family and friends with use of multimodality communication strategies as needed. Baseline: mod assist Goal status: MET  2.  Pt will increase  memory for functional tasks at home with use of compensatory strategies to [facility] with intermittent supervision. Baseline: full time supervision at this time Goal status: MET OBJECTIVE:  OBJECTIVE IMPAIRMENTS include attention, memory, executive functioning, and expressive language. These impairments are limiting patient from managing medications, managing appointments, and effectively communicating at home and in community. Factors affecting potential to achieve goals and functional outcome are ability to learn/carryover information. Patient will benefit from skilled SLP services to address above impairments and improve overall function.   REHAB POTENTIAL: Good   PLAN: D/C   TODAY'S TREATMENT: Pt was accompanied to therapy by her daughter, Olegario Shearer. Goals were reviewed and updated. She met all short term goals. In session, she named low frequency pictured objects with 100% acc, provided 3+ descriptions for pictured objects with 100% acc with indirect cues, oriented to day, date, year with 100% acc with min cues to use the calendar, and recalled 4/4 words after 5 and 15 minute delay after SLP facilitated use of memory strategies. Pt was able to call/text her daughter yesterday independently and stayed at home for several hours alone. She indicated that she wanted to get her laundry off the clothesline, but did not because she did not want to risk falling down the back steps to get there. She does not remember some of her previous passwords, so her family is helping to sort this now. Her daughter reports that Pt scheduled a dental appointment and asked her daughter to take her independently. Pt and daughter are pleased with her progress and feel comfortable with d/c from SLP services at this time. Pt reports feeling nervous when she attends medical appointments, so this will also impact her communication abilities and should be noted by her physicians. Will d/c from SLP services at this time and Pt to  continue with home program. Pt and daughter are in agreement with plan of care.   SPEECH THERAPY DISCHARGE SUMMARY  Visits from Start of Care: 8  Current functional level related to goals / functional outcomes: Met/see above   Remaining deficits: Mild cognitive linguistic deficits with    Education / Equipment: HEP provided/continue   Patient agrees to discharge. Patient goals were met. Patient is being discharged due to meeting the stated rehab goals..     Thank you,  Genene Churn, [place]  [place], [place] 12/27/2021, 10:16 AM

## 2021-12-30 ENCOUNTER — Telehealth: Payer: Self-pay | Admitting: Cardiology

## 2021-12-30 DIAGNOSIS — I10 Essential (primary) hypertension: Secondary | ICD-10-CM | POA: Diagnosis not present

## 2021-12-30 DIAGNOSIS — Z8673 Personal history of transient ischemic attack (TIA), and cerebral infarction without residual deficits: Secondary | ICD-10-CM | POA: Diagnosis not present

## 2021-12-30 NOTE — Telephone Encounter (Signed)
Pt c/o medication issue:  1. Name of Medication: chlorthalidone (HYGROTON) 25 MG tablet  2. How are you currently taking this medication (dosage and times per day)? As written  3. Are you having a reaction (difficulty breathing--STAT)? no  4. What is your medication issue? Pt's daughter, Lynden Ang states that pt PCP took her off of chlorthalidone because her bp has been high. She states that he would like to put her on amlodipine, but Vicky would like Dr. Di Kindle recommendation first because switching. Please advise.

## 2021-12-30 NOTE — Telephone Encounter (Signed)
Please advise 

## 2021-12-31 MED ORDER — AMLODIPINE BESYLATE 2.5 MG PO TABS: 2.5000 mg | ORAL_TABLET | Freq: Every day | ORAL | 3 refills | Status: DC

## 2021-12-31 NOTE — Telephone Encounter (Signed)
We had previously recommended she reduce the chlorthalidone to half tablet daily due to low blood pressure.  Has she discontinued completely?  Can we please get some recent blood pressure readings.  She noted prior leg swelling with amlodipine back in 2013.  This most often happens with a high dose of 10 mg.  Would be reasonable to try a lower dose such as 2.5 mg or 5 mg and monitor for any swelling.  Alver Sorrow, NP

## 2021-12-31 NOTE — Telephone Encounter (Signed)
RN returned call to patient   [phone] 53- 160/80 30-131/71  Patient is still taking Chlorthalidone half tablet daily, do we want to d/c this and start amlo or just make in addition?        "We had previously recommended she reduce the chlorthalidone to half tablet daily due to low blood pressure.  Has she discontinued completely?  Can we please get some recent blood pressure readings.   She noted prior leg swelling with amlodipine back in 2013.  This most often happens with a high dose of 10 mg.  Would be reasonable to try a lower dose such as 2.5 mg or 5 mg and monitor for any swelling.   Alver Sorrow, NP "

## 2021-12-31 NOTE — Telephone Encounter (Signed)
So long as no lightheadedness nor dizziness - start Amlodipine 2.5mg  QHS.   Continue Chlorthalidone half tablet daily, Doxazosin at current dose.   Alver Sorrow, NP

## 2021-12-31 NOTE — Telephone Encounter (Signed)
RN returned call to patient's daughter (ok per DPR) and advised of the following recommendations, Vicky verbalizes understanding and confirmed patient pharmacy. Rx sent.     "So long as no lightheadedness nor dizziness - start Amlodipine 2.5mg  QHS.    Continue Chlorthalidone half tablet daily, Doxazosin at current dose.    Alver Sorrow, NP "

## 2022-01-10 ENCOUNTER — Encounter (HOSPITAL_BASED_OUTPATIENT_CLINIC_OR_DEPARTMENT_OTHER): Payer: Self-pay | Admitting: Cardiology

## 2022-01-10 DIAGNOSIS — E7800 Pure hypercholesterolemia, unspecified: Secondary | ICD-10-CM | POA: Insufficient documentation

## 2022-01-10 DIAGNOSIS — Z8673 Personal history of transient ischemic attack (TIA), and cerebral infarction without residual deficits: Secondary | ICD-10-CM | POA: Insufficient documentation

## 2022-01-10 DIAGNOSIS — I10 Essential (primary) hypertension: Secondary | ICD-10-CM | POA: Insufficient documentation

## 2022-01-27 ENCOUNTER — Ambulatory Visit (INDEPENDENT_AMBULATORY_CARE_PROVIDER_SITE_OTHER): Payer: Medicare Other | Admitting: Cardiology

## 2022-01-27 ENCOUNTER — Encounter (HOSPITAL_BASED_OUTPATIENT_CLINIC_OR_DEPARTMENT_OTHER): Payer: Self-pay | Admitting: Cardiology

## 2022-01-27 VITALS — BP 153/86 | HR 71 | Ht 64.0 in | Wt 123.1 lb

## 2022-01-27 DIAGNOSIS — I639 Cerebral infarction, unspecified: Secondary | ICD-10-CM | POA: Diagnosis not present

## 2022-01-27 DIAGNOSIS — Z7189 Other specified counseling: Secondary | ICD-10-CM | POA: Diagnosis not present

## 2022-01-27 DIAGNOSIS — Z8673 Personal history of transient ischemic attack (TIA), and cerebral infarction without residual deficits: Secondary | ICD-10-CM

## 2022-01-27 DIAGNOSIS — E7800 Pure hypercholesterolemia, unspecified: Secondary | ICD-10-CM | POA: Diagnosis not present

## 2022-01-27 DIAGNOSIS — I10 Essential (primary) hypertension: Secondary | ICD-10-CM | POA: Diagnosis not present

## 2022-01-27 DIAGNOSIS — Z712 Person consulting for explanation of examination or test findings: Secondary | ICD-10-CM | POA: Diagnosis not present

## 2022-01-27 NOTE — Patient Instructions (Addendum)
Medication Instructions:  Stay on current doses of chlorthalidone, doxazosin, and losartan. I would not start the amlodipine unless you see the blood pressure numbers consistently in the 140s-150s on the top.  *If you need a refill on your cardiac medications before your next appointment, please call your pharmacy*   Lab Work: None ordered today   Testing/Procedures: None ordered today   Follow-Up: At [facility], you and your health needs are our priority.  As part of our continuing mission to provide you with exceptional heart care, we have created designated Provider Care Teams.  These Care Teams include your primary Cardiologist (physician) and Advanced Practice Providers (APPs -  Physician Assistants and Nurse Practitioners) who all work together to provide you with the care you need, when you need it.  We recommend signing up for the patient portal called "MyChart".  Sign up information is provided on this After Visit Summary.  MyChart is used to connect with patients for Virtual Visits (Telemedicine).  Patients are able to view lab/test results, encounter notes, upcoming appointments, etc.  Non-urgent messages can be sent to your provider as well.   To learn more about what you can do with MyChart, go to [email or web address].    Your next appointment:   1 year(s)  The format for your next appointment:   In Person  Provider:   Jodelle Red, MD

## 2022-01-27 NOTE — Progress Notes (Signed)
Cardiology Office Note:    Date:  01/28/2022   ID:  Janice [name], DOB 1940-07-26, MRN [id number]  PCP:  Neale Burly, MD  Cardiologist:  Buford Dresser, MD  Referring MD: Neale Burly, MD   CC: follow up  History of Present Illness:    Janice [name] is a 81 y.o. female with a hx of CVA, hypertension, hypercholesterolemia who is seen for follow up today. I initially met her 12/31/21 as a new consult at the request of Hasanaj, Samul Dada, MD for the evaluation and management of CVA history, rule out embolic etiology.  Cardiovascular risk factors: Prior clinical ASCVD: CVA 11/08/21 Comorbid conditions: hypertension--for many years (decades). Had issues with control a few months before the stroke, had changed medications. Took a medication one time, heart started skipping. They think this may have been benazepril. Remote issue with amlodipine (swelling). Recently started on atorvastatin for hyperlipidemia. No diabetes, chronic kidney disease  Metabolic syndrome/Obesity: BMI 22 Chronic inflammatory conditions: none Tobacco use history: very remotely/briefly Family history: no history of heart disease or stroke Prior pertinent testing and/or incidental findings: echo reviewed.  Today: Here with her two daughters today.   Blood pressures were persistently elevated prior to 12/04/21, with highest 205/94. Most 588F-027X systolic. Since 6/23, has been much improved. Started chlorthalidone 6/22, felt lightheaded initially but now tolerating well at a lower dose. Had a few high days but most 110s-130s, averaging about 130/70.   Current regimen: Not taking amlodipine (was worried about the leg swelling) Chlorthalidone 12.5 mg daily in the AM Losartan 100 mg daily in the AM Doxazosin 2 mg at bedtime.  Reviewed her monitor results in depth today. No evidence of afib.  Denies chest pain, shortness of breath at rest or with normal exertion. No PND, orthopnea, LE edema or unexpected  weight gain. No syncope or palpitations.   Past Medical History:  Diagnosis Date   Anemia    History of blood transfusion    no reaction   Hypertension     Past Surgical History:  Procedure Laterality Date   CATARACT EXTRACTION W/PHACO  01/23/2012   Procedure: CATARACT EXTRACTION PHACO AND INTRAOCULAR LENS PLACEMENT ([place]);  Surgeon: Tonny Branch, MD;  Location: [facility];  Service: Ophthalmology;  Laterality: Right;  CDE 18.26   CATARACT EXTRACTION W/PHACO  02/13/2012   Procedure: CATARACT EXTRACTION PHACO AND INTRAOCULAR LENS PLACEMENT ([facility]);  Surgeon: Tonny Branch, MD;  Location: [facility];  Service: Ophthalmology;  Laterality: Left;  CDE:14.14   CHOLECYSTECTOMY  1981   DILATION AND CURETTAGE OF UTERUS     TONSILLECTOMY  age 54   TUBAL LIGATION      Current Medications: Current Outpatient Medications on File Prior to Visit  Medication Sig   aspirin 81 MG EC tablet Take 1 tablet (81 mg total) by mouth daily. Swallow whole.   atorvastatin (LIPITOR) 80 MG tablet Take 1 tablet (80 mg total) by mouth daily.   chlorthalidone (HYGROTON) 25 MG tablet Take 0.5 tablets (12.5 mg total) by mouth daily.   clopidogrel (PLAVIX) 75 MG tablet Take 1 tablet (75 mg total) by mouth daily.   doxazosin (CARDURA) 2 MG tablet Take 1 tablet (2 mg total) by mouth at bedtime.   losartan (COZAAR) 100 MG tablet Take 100 mg by mouth daily.   No current facility-administered medications on file prior to visit.     Allergies:   Norvasc [amlodipine]   Social History   Tobacco Use   Smoking status: Never  Substance Use Topics   Alcohol use: No   Drug use: No    Family History: No family history of heart disease or stroke  ROS:   Please see the history of present illness.  Additional pertinent ROS otherwise unremarkable.   EKGs/Labs/Other Studies Reviewed:    The following studies were reviewed today: Monitor 01/27/22 30 days of data recorded on [employer] monitor. Patient had a min HR of 45 bpm, max HR of  131 bpm, and avg HR of 61 bpm. Predominant underlying rhythm was Sinus Rhythm. No VT, atrial fibrillation, high degree block, or pauses noted. A single episode of SVT noted lasting 6.95 seconds. Isolated atrial and ventricular ectopy was rare (1%). No significant arrhythmias detected.   Echo 11/09/21 1. Left ventricular ejection fraction, by estimation, is 55 to 60%. The  left ventricle has normal function. The left ventricle has no regional  wall motion abnormalities. Left ventricular diastolic parameters are  consistent with Grade II diastolic  dysfunction (pseudonormalization).   2. Right ventricular systolic function is normal. The right ventricular  size is normal. There is normal pulmonary artery systolic pressure. The  estimated right ventricular systolic pressure is 53.6 mmHg.   3. Left atrial size was moderately dilated.   4. The mitral valve is grossly normal. Mild mitral valve regurgitation.  No evidence of mitral stenosis.   5. The aortic valve is tricuspid. Aortic valve regurgitation is not  visualized. No aortic stenosis is present.   6. The inferior vena cava is normal in size with greater than 50%  respiratory variability, suggesting right atrial pressure of 3 mmHg.  EKG:  EKG is personally reviewed.   12/23/21 Sinus rhythm with sinus arrhythmia at 68 bpm, borderline LVH  Recent Labs: 11/08/2021: Hemoglobin 13.8; Platelets 192 11/09/2021: ALT 16; TSH 4.766 11/10/2021: BUN 12; Creatinine, Ser 1.00; Potassium 3.5; Sodium 139  Recent Lipid Panel    Component Value Date/Time   CHOL 207 (H) 11/09/2021 0510   TRIG 71 11/09/2021 0510   HDL 52 11/09/2021 0510   CHOLHDL 4.0 11/09/2021 0510   VLDL 14 11/09/2021 0510   LDLCALC 141 (H) 11/09/2021 0510    Physical Exam:    VS:  BP (!) 153/86 (BP Location: Right Arm, Patient Position: Sitting, Cuff Size: Normal)   Pulse 71   Ht '5\' 4"'  (1.626 m)   Wt 123 lb 1.6 oz (55.8 kg)   SpO2 94%   BMI 21.13 kg/m     Wt Readings from Last  3 Encounters:  01/27/22 123 lb 1.6 oz (55.8 kg)  12/23/21 130 lb 6.4 oz (59.1 kg)  11/09/21 136 lb 14.5 oz (62.1 kg)    GEN: Well nourished, well developed in no acute distress HEENT: Normal, moist mucous membranes NECK: No JVD CARDIAC: regular rhythm, normal S1 and S2, no rubs or gallops. No murmur. VASCULAR: Radial and DP pulses 2+ bilaterally. No carotid bruits RESPIRATORY:  Clear to auscultation without rales, wheezing or rhonchi  ABDOMEN: Soft, non-tender, non-distended MUSCULOSKELETAL:  Ambulates independently SKIN: Warm and dry, no edema NEUROLOGIC:  Alert and oriented x 3. No focal neuro deficits noted. PSYCHIATRIC:  Normal affect    ASSESSMENT:    1. History of CVA (cerebrovascular accident)   2. Primary hypertension   3. Pure hypercholesterolemia   4. Cardiac risk counseling   5. Encounter to discuss test results     PLAN:    History of CVA Hypercholesterolemia -no afib on monitor, reviewed results today -followed by neurology -continue aspirin, clopidogrel  as directed by neurology -continue atorvastatin  Hypertension -elevated today in office, but home log reviewed and excellent control -continue chlorthalidone 12.5 mg, losartan 100 mg, doxazosin 2 mg  Cardiac risk counseling and prevention recommendations: -recommend heart healthy/Mediterranean diet, with whole grains, fruits, vegetable, fish, lean meats, nuts, and olive oil. Limit salt. -recommend moderate walking, 3-5 times/week for 30-50 minutes each session. Aim for at least 150 minutes.week. Goal should be pace of 3 miles/hours, or walking 1.5 miles in 30 minutes -recommend avoidance of tobacco products. Avoid excess alcohol.  Plan for follow up:  1 year or sooner as needed  Buford Dresser, MD, PhD, [facility]    Medication Adjustments/Labs and Tests Ordered: Current medicines are reviewed at length with the patient today.  Concerns regarding medicines are outlined  above.  No orders of the defined types were placed in this encounter.  No orders of the defined types were placed in this encounter.   Patient Instructions  Medication Instructions:  Stay on current doses of chlorthalidone, doxazosin, and losartan. I would not start the amlodipine unless you see the blood pressure numbers consistently in the 140s-150s on the top.  *If you need a refill on your cardiac medications before your next appointment, please call your pharmacy*   Lab Work: None ordered today   Testing/Procedures: None ordered today   Follow-Up: At [facility], you and your health needs are our priority.  As part of our continuing mission to provide you with exceptional heart care, we have created designated Provider Care Teams.  These Care Teams include your primary Cardiologist (physician) and Advanced Practice Providers (APPs -  Physician Assistants and Nurse Practitioners) who all work together to provide you with the care you need, when you need it.  We recommend signing up for the patient portal called "MyChart".  Sign up information is provided on this After Visit Summary.  MyChart is used to connect with patients for Virtual Visits (Telemedicine).  Patients are able to view lab/test results, encounter notes, upcoming appointments, etc.  Non-urgent messages can be sent to your provider as well.   To learn more about what you can do with MyChart, go to [email or web address].    Your next appointment:   1 year(s)  The format for your next appointment:   In Person  Provider:   Buford Dresser, MD      Signed, Buford Dresser, MD PhD 01/28/2022     [place]

## 2022-01-28 ENCOUNTER — Encounter (HOSPITAL_BASED_OUTPATIENT_CLINIC_OR_DEPARTMENT_OTHER): Payer: Self-pay | Admitting: Cardiology

## 2022-04-12 DIAGNOSIS — Z8673 Personal history of transient ischemic attack (TIA), and cerebral infarction without residual deficits: Secondary | ICD-10-CM | POA: Diagnosis not present

## 2022-04-12 DIAGNOSIS — I10 Essential (primary) hypertension: Secondary | ICD-10-CM | POA: Diagnosis not present

## 2022-07-13 DIAGNOSIS — Z23 Encounter for immunization: Secondary | ICD-10-CM | POA: Diagnosis not present

## 2022-07-21 DIAGNOSIS — E782 Mixed hyperlipidemia: Secondary | ICD-10-CM | POA: Diagnosis not present

## 2022-07-21 DIAGNOSIS — Z8673 Personal history of transient ischemic attack (TIA), and cerebral infarction without residual deficits: Secondary | ICD-10-CM | POA: Diagnosis not present

## 2022-07-21 DIAGNOSIS — Z23 Encounter for immunization: Secondary | ICD-10-CM | POA: Diagnosis not present

## 2022-07-21 DIAGNOSIS — I10 Essential (primary) hypertension: Secondary | ICD-10-CM | POA: Diagnosis not present

## 2022-10-20 DIAGNOSIS — Z8673 Personal history of transient ischemic attack (TIA), and cerebral infarction without residual deficits: Secondary | ICD-10-CM | POA: Diagnosis not present

## 2022-10-20 DIAGNOSIS — E782 Mixed hyperlipidemia: Secondary | ICD-10-CM | POA: Diagnosis not present

## 2022-10-20 DIAGNOSIS — Z131 Encounter for screening for diabetes mellitus: Secondary | ICD-10-CM | POA: Diagnosis not present

## 2022-10-20 DIAGNOSIS — I10 Essential (primary) hypertension: Secondary | ICD-10-CM | POA: Diagnosis not present

## 2022-10-20 DIAGNOSIS — Z1331 Encounter for screening for depression: Secondary | ICD-10-CM | POA: Diagnosis not present

## 2022-10-20 DIAGNOSIS — Z0000 Encounter for general adult medical examination without abnormal findings: Secondary | ICD-10-CM | POA: Diagnosis not present

## 2023-01-03 ENCOUNTER — Telehealth (HOSPITAL_BASED_OUTPATIENT_CLINIC_OR_DEPARTMENT_OTHER): Payer: Self-pay | Admitting: Cardiology

## 2023-01-03 ENCOUNTER — Encounter (HOSPITAL_BASED_OUTPATIENT_CLINIC_OR_DEPARTMENT_OTHER): Payer: Self-pay

## 2023-01-03 NOTE — Telephone Encounter (Signed)
Dr. Cristal Deer not in the office on Friday 02/24/23---left message for patient with new appointment date---Friday 03/03/23 at 4:20 pm.  Requested return confirmation call and will also send My Chart message

## 2023-01-19 DIAGNOSIS — I10 Essential (primary) hypertension: Secondary | ICD-10-CM | POA: Diagnosis not present

## 2023-01-19 DIAGNOSIS — E782 Mixed hyperlipidemia: Secondary | ICD-10-CM | POA: Diagnosis not present

## 2023-01-19 DIAGNOSIS — N1831 Chronic kidney disease, stage 3a: Secondary | ICD-10-CM | POA: Diagnosis not present

## 2023-01-19 DIAGNOSIS — Z8673 Personal history of transient ischemic attack (TIA), and cerebral infarction without residual deficits: Secondary | ICD-10-CM | POA: Diagnosis not present

## 2023-02-24 ENCOUNTER — Ambulatory Visit (HOSPITAL_BASED_OUTPATIENT_CLINIC_OR_DEPARTMENT_OTHER): Payer: Medicare Other | Admitting: Cardiology

## 2023-03-03 ENCOUNTER — Ambulatory Visit (HOSPITAL_BASED_OUTPATIENT_CLINIC_OR_DEPARTMENT_OTHER): Payer: Medicare Other | Admitting: Cardiology

## 2023-03-03 VITALS — BP 122/84 | HR 68 | Ht 64.0 in | Wt 127.4 lb

## 2023-03-03 DIAGNOSIS — Z8673 Personal history of transient ischemic attack (TIA), and cerebral infarction without residual deficits: Secondary | ICD-10-CM

## 2023-03-03 DIAGNOSIS — Z7189 Other specified counseling: Secondary | ICD-10-CM

## 2023-03-03 DIAGNOSIS — E7800 Pure hypercholesterolemia, unspecified: Secondary | ICD-10-CM | POA: Diagnosis not present

## 2023-03-03 DIAGNOSIS — I10 Essential (primary) hypertension: Secondary | ICD-10-CM | POA: Diagnosis not present

## 2023-03-03 NOTE — Patient Instructions (Signed)
Medication Instructions:  No change *If you need a refill on your cardiac medications before your next appointment, please call your pharmacy*   Lab Work: None If you have labs (blood work) drawn today and your tests are completely normal, you will receive your results only by: MyChart Message (if you have MyChart) OR A paper copy in the mail If you have any lab test that is abnormal or we need to change your treatment, we will call you to review the results.   Testing/Procedures: None   Follow-Up: At [facility], you and your health needs are our priority.  As part of our continuing mission to provide you with exceptional heart care, we have created designated Provider Care Teams.  These Care Teams include your primary Cardiologist (physician) and Advanced Practice Providers (APPs -  Physician Assistants and Nurse Practitioners) who all work together to provide you with the care you need, when you need it.  We recommend signing up for the patient portal called "MyChart".  Sign up information is provided on this After Visit Summary.  MyChart is used to connect with patients for Virtual Visits (Telemedicine).  Patients are able to view lab/test results, encounter notes, upcoming appointments, etc.  Non-urgent messages can be sent to your provider as well.   To learn more about what you can do with MyChart, go to [email or web address].    Your next appointment:   1 year(s)  Provider:   Jodelle Red, MD    Other Instructions Call us sooner if you need [place]!

## 2023-03-03 NOTE — Progress Notes (Signed)
Cardiology Office Note:  .    Date:  03/03/2023  ID:  LEXXIE STOECKER, DOB 09-02-1940, MRN [id number] PCP: Toma Deiters, MD  [facility] Providers Cardiologist:  Jodelle Red, MD     History of Present Illness: Marland Kitchen    Janice [name] is a 82 y.o. female with a hx of CVA, hypertension, hypercholesterolemia, who is seen for follow up today. I initially met her 12/31/21 as a new consult at the request of Hasanaj, Myra Gianotti, MD for the evaluation and management of CVA history, rule out embolic etiology.   Cardiovascular risk factors: Prior clinical ASCVD: CVA 11/08/21 Comorbid conditions: hypertension--for many years (decades). Had issues with control a few months before the stroke, had changed medications. Took a medication one time, heart started skipping. They think this may have been benazepril. Remote issue with amlodipine (swelling). Recently started on atorvastatin for hyperlipidemia. No diabetes, chronic kidney disease  Metabolic syndrome/Obesity: BMI 22 Chronic inflammatory conditions: none Tobacco use history: very remotely/briefly Family history: no history of heart disease or stroke Prior pertinent testing and/or incidental findings: echo reviewed.   At her visit 01/2022, we reviewed her blood pressures which were persistently elevated prior to 12/04/21, with highest 205/94. Most 160s-170s systolic. Since 6/23, had been much improved. Started chlorthalidone 6/22, felt lightheaded initially but now tolerating well at a lower dose. Had a few high days but most 110s-130s, averaging about 130/70. Her regimen at that visit included Chlorthalidone 12.5 mg daily in the AM, Losartan 100 mg daily in the AM, Doxazosin 2 mg at bedtime. Not taking amlodipine (was worried about the leg swelling). Reviewed her monitor results in depth: no evidence of afib.  Today, she is here with her two daughters. She states that she is doing well and eating better. In the office her blood pressure  is 122/84. Has had some higher readings at home such as 143 systolic, but she was asymptomatic. She continues to tolerate her medications.   Recently her more intense physical activity includes cleaning on the weekends, such as mopping her floors and vacuuming. We discussed ways to increase her exercise.   She denies any palpitations, chest pain, shortness of breath, peripheral edema, lightheadedness, headaches, syncope, orthopnea, or PND. No hematuria or hematochezia. No weakness or blurred vision.  ROS:  Please see the history of present illness. ROS otherwise negative except as noted.  (+) Easy bruising  Studies Reviewed: Marland Kitchen    EKG Interpretation Date/Time:  Friday March 03 2023 14:56:12 EDT Ventricular Rate:  68 PR Interval:  166 QRS Duration:  76 QT Interval:  414 QTC Calculation: 440 R Axis:   -15  Text Interpretation: Normal sinus rhythm Minimal voltage criteria for LVH, may be normal variant low septal forces Confirmed by Jodelle Red [phone]) on 03/03/2023 3:09:03 PM     Physical Exam:    VS:  BP 122/84   Pulse 68   Ht 5\' 4"  (1.626 m)   Wt 127 lb 6.4 oz (57.8 kg)   SpO2 99%   BMI 21.87 kg/m    Wt Readings from Last 3 Encounters:  03/03/23 127 lb 6.4 oz (57.8 kg)  01/27/22 123 lb 1.6 oz (55.8 kg)  12/23/21 130 lb 6.4 oz (59.1 kg)    GEN: Well nourished, well developed in no acute distress HEENT: Normal, moist mucous membranes NECK: No JVD CARDIAC: regular rhythm, normal S1 and S2, no rubs or gallops. No murmur. VASCULAR: Radial and DP pulses 2+ bilaterally. No carotid bruits RESPIRATORY:  Clear to auscultation without rales, wheezing or rhonchi  ABDOMEN: Soft, non-tender, non-distended MUSCULOSKELETAL:  Ambulates independently SKIN: Warm and dry, no edema NEUROLOGIC:  Alert and oriented x 3. No focal neuro deficits noted. PSYCHIATRIC:  Normal affect   ASSESSMENT AND PLAN: .    History of CVA Hypercholesterolemia -no afib on monitor -followed by  neurology -continue aspirin, clopidogrel as directed by neurology -continue atorvastatin   Hypertension -home log reviewed and excellent control -continue chlorthalidone 12.5 mg, losartan 100 mg, doxazosin 2 mg   Cardiac risk counseling and prevention recommendations: -recommend heart healthy/Mediterranean diet, with whole grains, fruits, vegetable, fish, lean meats, nuts, and olive oil. Limit salt. -recommend moderate walking, 3-5 times/week for 30-50 minutes each session. Aim for at least 150 minutes.week. Goal should be pace of 3 miles/hours, or walking 1.5 miles in 30 minutes -recommend avoidance of tobacco products. Avoid excess alcohol.  Dispo: Follow-up in 1 year, or sooner as needed.  I,Mathew Stumpf,acting as a Neurosurgeon for [employer], MD.,have documented all relevant documentation on the behalf of Jodelle Red, MD,as directed by  Jodelle Red, MD while in the presence of Jodelle Red, MD.  I, Jodelle Red, MD, have reviewed all documentation for this visit. The documentation on 04/17/23 for the exam, diagnosis, procedures, and orders are all accurate and complete.   Signed, Jodelle Red, MD

## 2023-03-15 DIAGNOSIS — H4720 Unspecified optic atrophy: Secondary | ICD-10-CM | POA: Diagnosis not present

## 2023-03-15 DIAGNOSIS — H524 Presbyopia: Secondary | ICD-10-CM | POA: Diagnosis not present

## 2023-03-15 DIAGNOSIS — H52223 Regular astigmatism, bilateral: Secondary | ICD-10-CM | POA: Diagnosis not present

## 2023-03-15 DIAGNOSIS — H5203 Hypermetropia, bilateral: Secondary | ICD-10-CM | POA: Diagnosis not present

## 2023-03-15 DIAGNOSIS — Z961 Presence of intraocular lens: Secondary | ICD-10-CM | POA: Diagnosis not present

## 2023-04-01 IMAGING — MR MR HEAD W/O CM
12 of 13 series · 44 of 48 positions shown · non-contrast
Comparison: Prior CT from 11/08/2021.

CLINICAL DATA: Initial evaluation for acute neuro deficit, stroke
suspected.

EXAM:
MRI HEAD WITHOUT CONTRAST
TECHNIQUE: Multiplanar, multiecho pulse sequences of the brain and surrounding
structures were obtained without intravenous contrast.

[Series 5: DWI · axial · 3.0mm · 0.92mm/px · z∈[-106,+57]mm · 7 of 111 slices shown (1 of 4)]
[im 1/111]
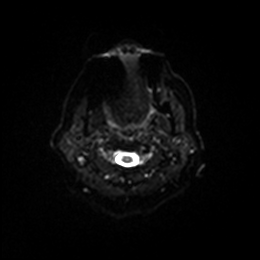
[im 19/111]
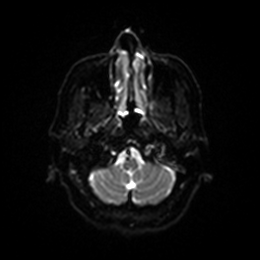
[im 37/111]
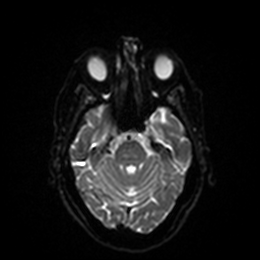
[im 56/111]
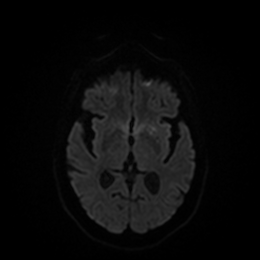
[im 74/111]
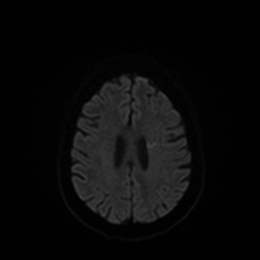
[im 92/111]
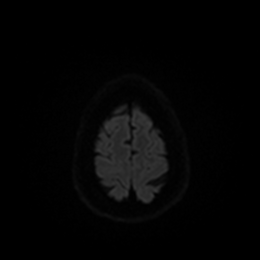
[im 111/111]
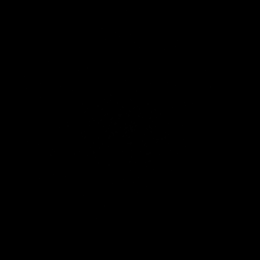

[Series 6: DWI · axial · 3.0mm · 0.92mm/px · z∈[-106,+51]mm · 4 of 54 slices shown (2 of 4)]
[im 1/54]
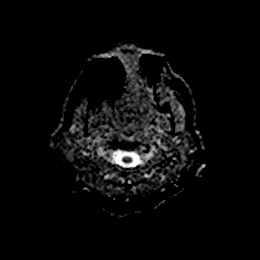
[im 18/54]
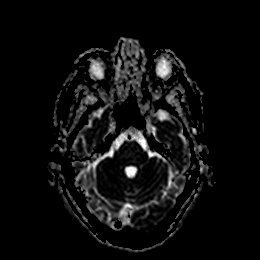
[im 36/54]
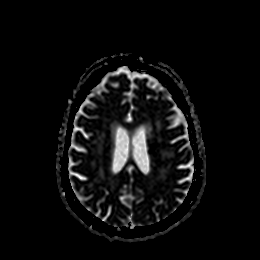
[im 54/54]
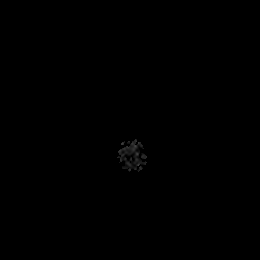

[Series 7: DWI · coronal · 4.0mm · 0.88mm/px · 6 of 80 slices shown (3 of 4)]
[im 1/80]
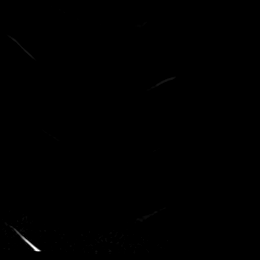
[im 16/80]
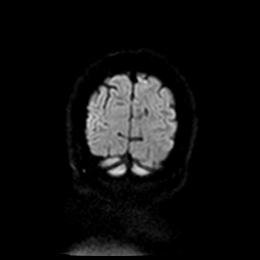
[im 32/80]
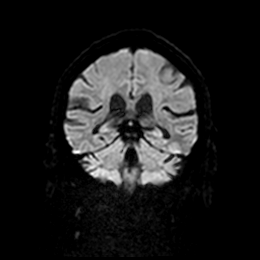
[im 48/80]
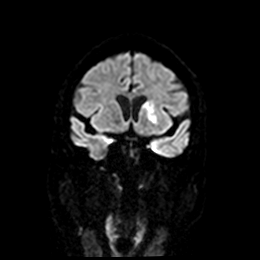
[im 64/80]
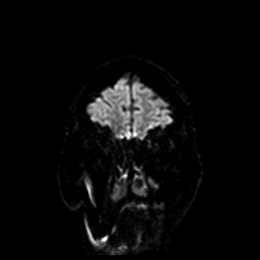
[im 80/80]
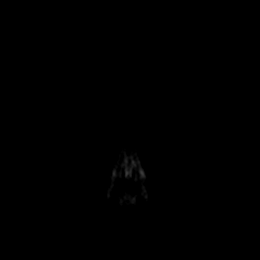

[Series 8: DWI · coronal · 4.0mm · 0.88mm/px · 3 of 40 slices shown (4 of 4)]
[im 1/40]
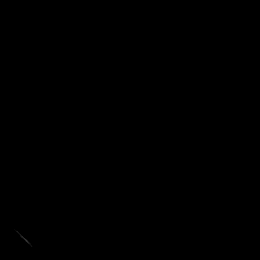
[im 20/40]
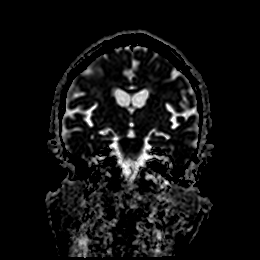
[im 40/40]
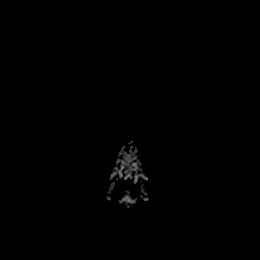

[Series 9: T1 · sagittal · 5.0mm · 0.75mm/px · 2 of 26 slices shown]
[im 1/26]
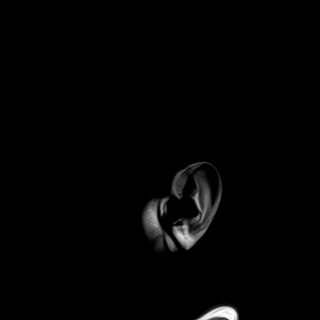
[im 26/26]
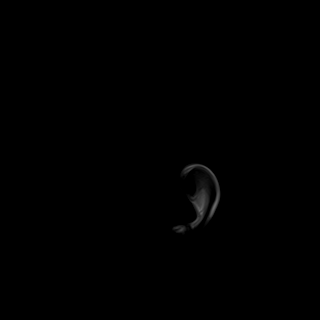

[Series 10: T2 · axial · 5.0mm · 0.72mm/px · z∈[-104,+57]mm · 2 of 28 slices shown (1 of 2)]
[im 1/28]
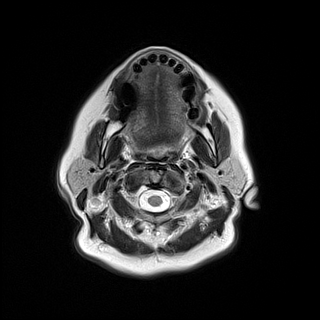
[im 28/28]
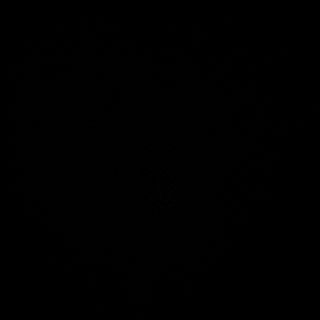

[Series 11: FLAIR · axial · 5.0mm · 0.47mm/px · z∈[-101,+59]mm · 2 of 28 slices shown]
[im 1/28]
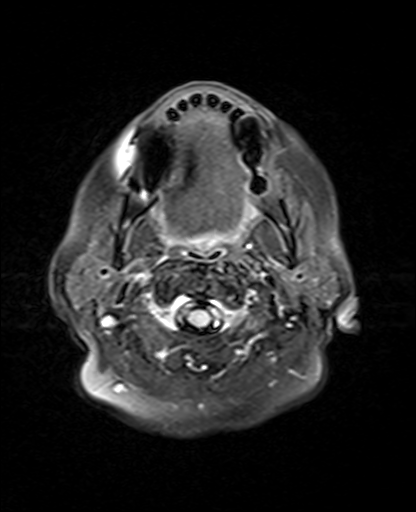
[im 28/28]
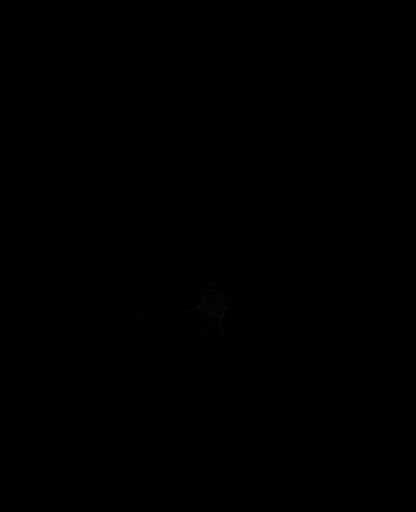

[Series 12: mag_images · axial · 3.0mm · 0.90mm/px · z∈[-102,+61]mm · 4 of 56 slices shown]
[im 1/56]
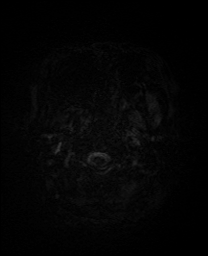
[im 19/56]
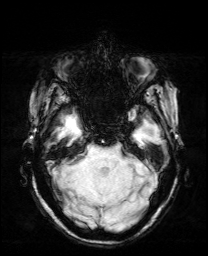
[im 37/56]
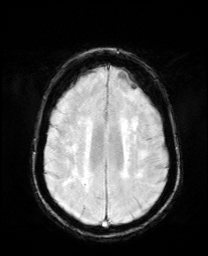
[im 56/56]
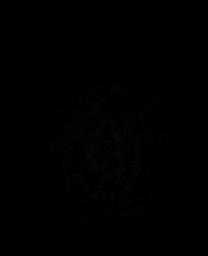

[Series 13: pha_images · axial · 3.0mm · 0.90mm/px · z∈[-102,+52]mm · 4 of 53 slices shown]
[im 1/53]
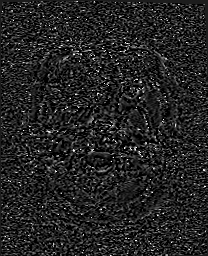
[im 18/53]
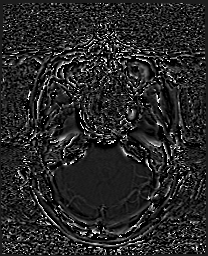
[im 35/53]
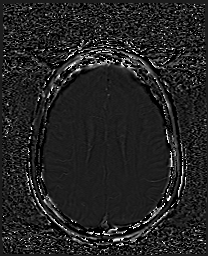
[im 53/53]
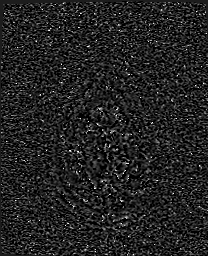

[Series 14: swi_images · axial · 3.0mm · 0.90mm/px · z∈[-102,+61]mm · 4 of 56 slices shown]
[im 1/56]
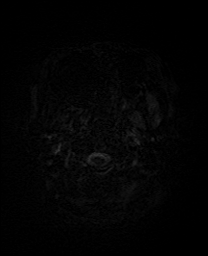
[im 19/56]
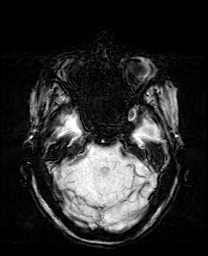
[im 37/56]
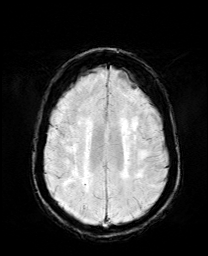
[im 56/56]
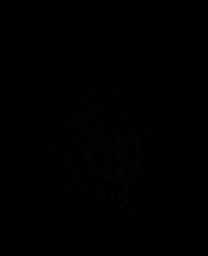

[Series 15: mip_images(sw) · axial · 24.0mm · 0.90mm/px · z∈[-92,+51]mm · 4 of 49 slices shown]
[im 1/49]
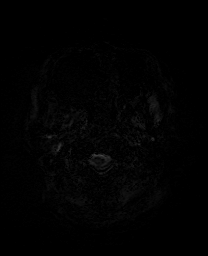
[im 17/49]
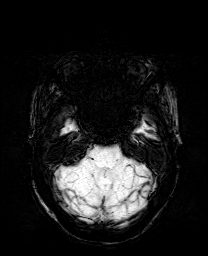
[im 33/49]
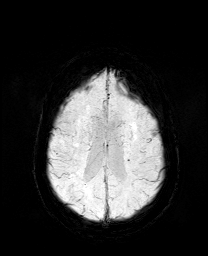
[im 49/49]
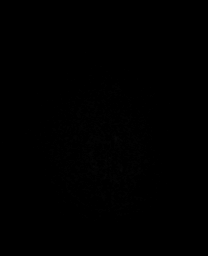

[Series 17: T2 · coronal · 5.0mm · 0.34mm/px · 2 of 33 slices shown (2 of 2)]
[im 1/33]
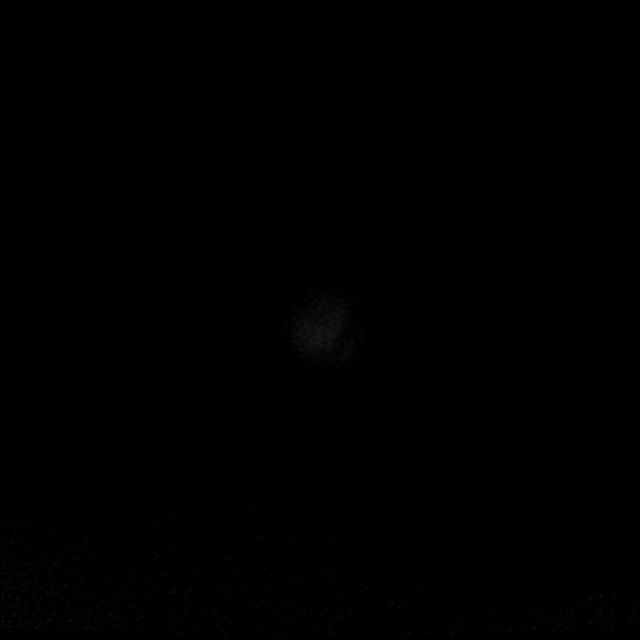
[im 33/33]
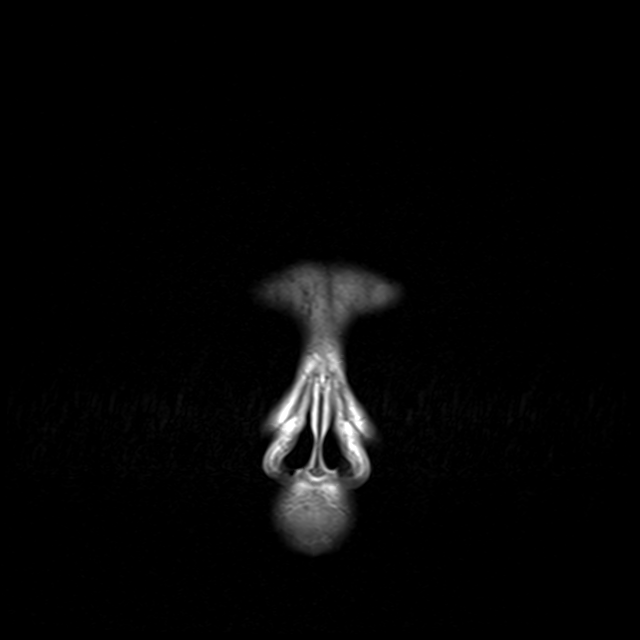

[44 of 48 positions shown; findings below may reference images not displayed]

FINDINGS: Brain: Cerebral volume within normal limits. Patchy and confluent
T2/FLAIR hyperintensity involving the periventricular deep white
matter both cerebral hemispheres as well as the pons, most
consistent with chronic small vessel ischemic disease, moderately
advanced. Small remote lacunar infarct present at the right
thalamus.

Patchy restricted diffusion seen involving the left basal ganglia,
with involvement of the left caudate and lentiform nuclei,
consistent with acute ischemic infarct. No associated hemorrhage or
mass effect. Area of infarction measures up to approximately 3 cm in
maximal diameter. No other evidence for acute or subacute ischemia.
Gray-white matter differentiation otherwise maintained. No acute
intracranial hemorrhage. Few small punctate chronic micro
hemorrhages noted, likely small vessel/hypertensive in nature.

No mass lesion, mass effect, or midline shift. No hydrocephalus or
extra-axial fluid collection. Pituitary gland suprasellar region
normal.

Vascular: Major intracranial vascular flow voids are maintained.

Skull and upper cervical spine: Craniocervical junction within
normal limits. Bone marrow signal intensity normal. No scalp soft
tissue abnormality.

Sinuses/Orbits: Prior ocular lens replacement. No significant
mastoid effusion. Paranasal sinuses are largely clear.

Other: None.
IMPRESSION: 1. Patchy acute ischemic nonhemorrhagic left basal ganglia infarct.
2. Underlying moderate chronic microvascular ischemic disease. Small
remote lacunar infarct at the right thalamus.

## 2023-04-03 IMAGING — CT CT HEAD W/O CM
4 series · 16 of 47 positions shown, 18 images · non-contrast
Comparison: CT head November 08, 2021.  MRI head November 09, 2021.

CLINICAL DATA: Neuro deficit, acute, stroke suspected



[Series 3: head wo · axial · 0.42mm/px · z∈[-120,+0]mm · 7 of 32 slices shown, 9 images]
[im 4/32  brain]
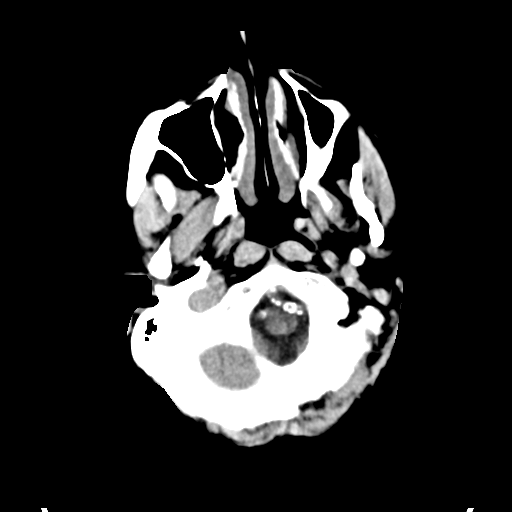
[im 4/32  bone]
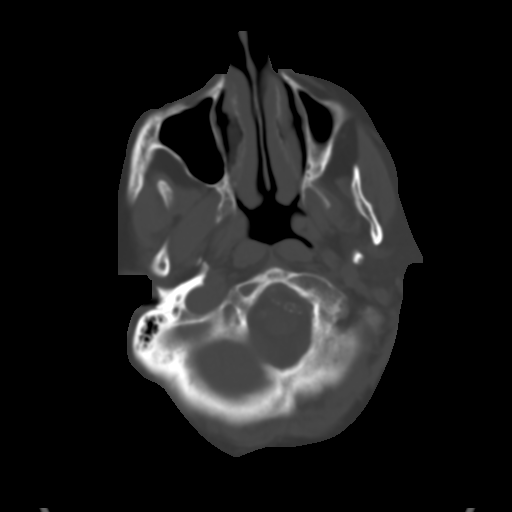
[im 8/32  brain]
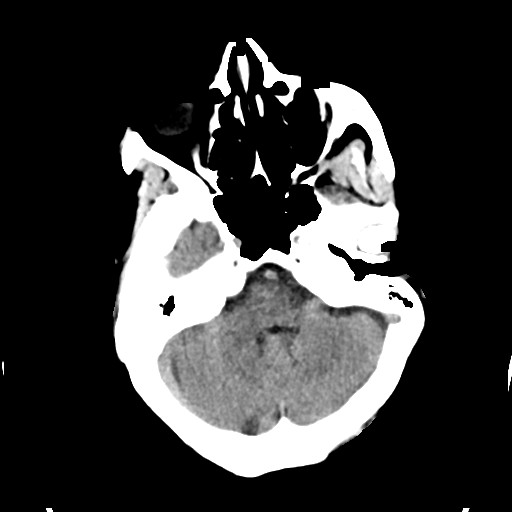
[im 12/32  brain]
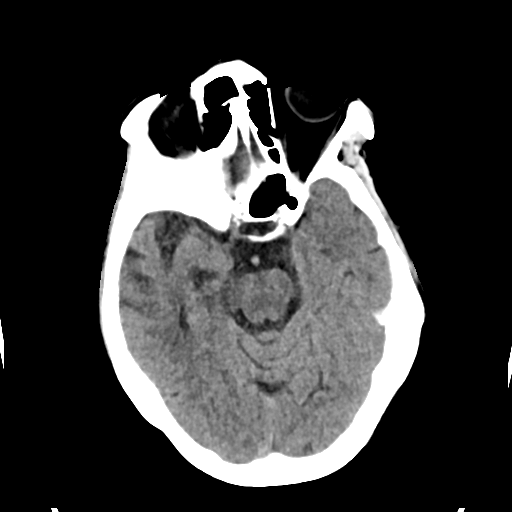
[im 16/32  brain]
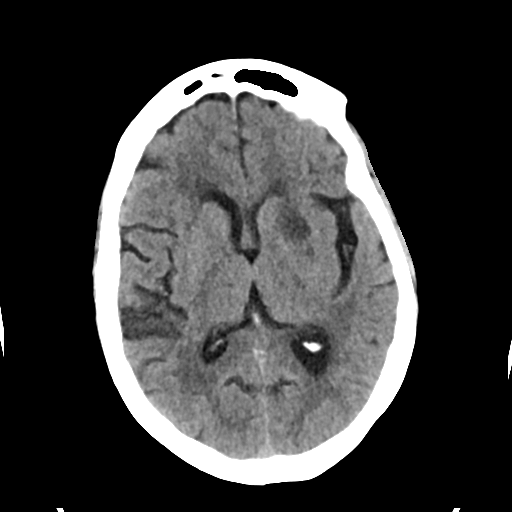
[im 20/32  brain]
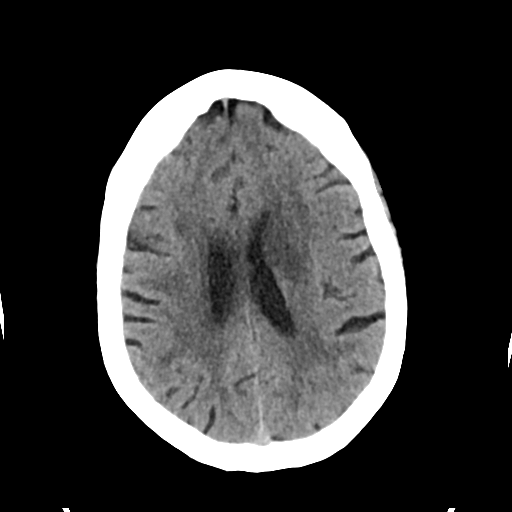
[im 20/32  bone]
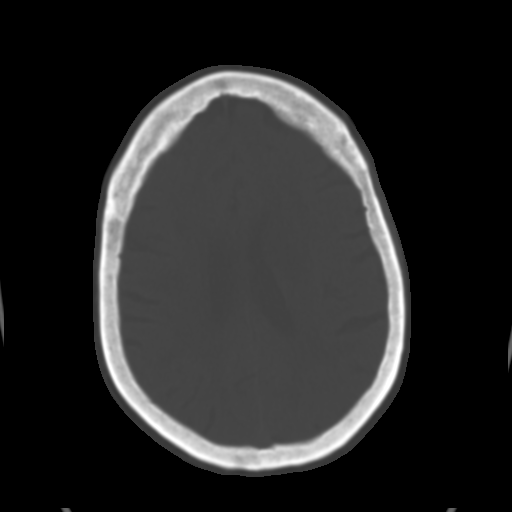
[im 24/32  brain]
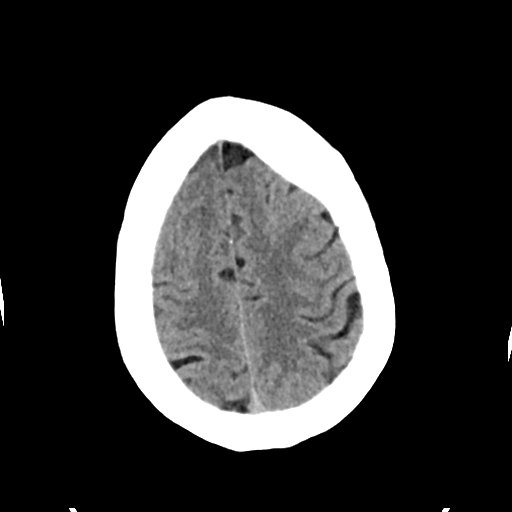
[im 28/32  brain]
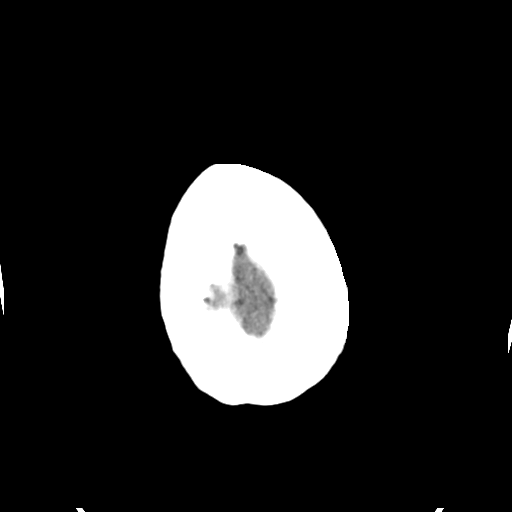

[Series 4: head bone · axial · 0.42mm/px · z∈[-121,-89]mm · 3 of 79 slices shown]
[im 8/79  bone]
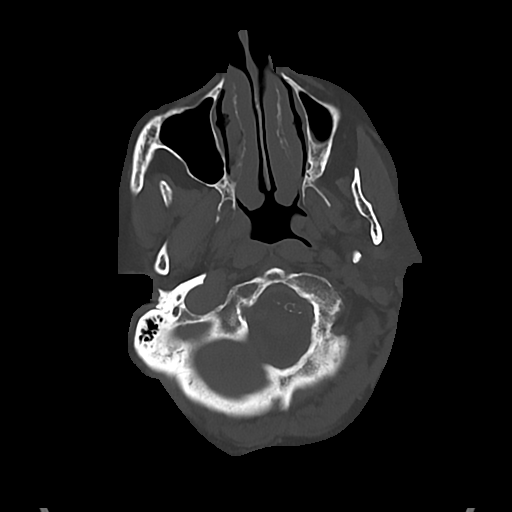
[im 16/79  bone]
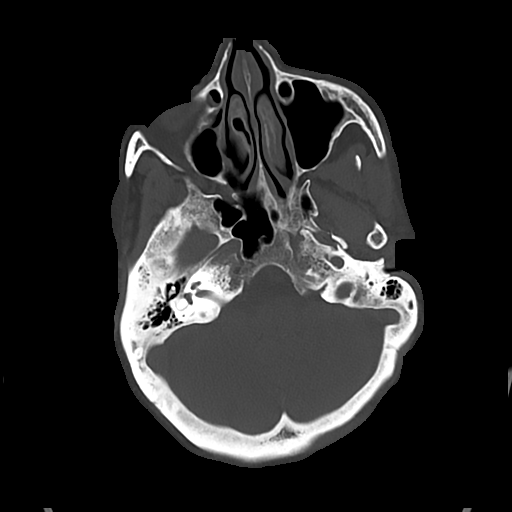
[im 24/79  bone]
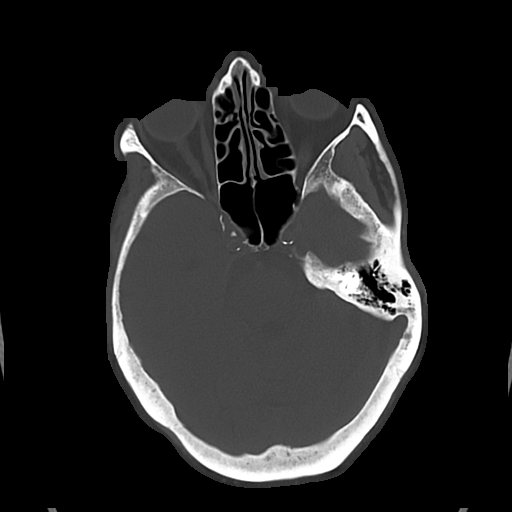

[Series 5: cor soft · coronal · 0.33mm/px · 3 of 70 slices shown]
[im 24/70  brain]
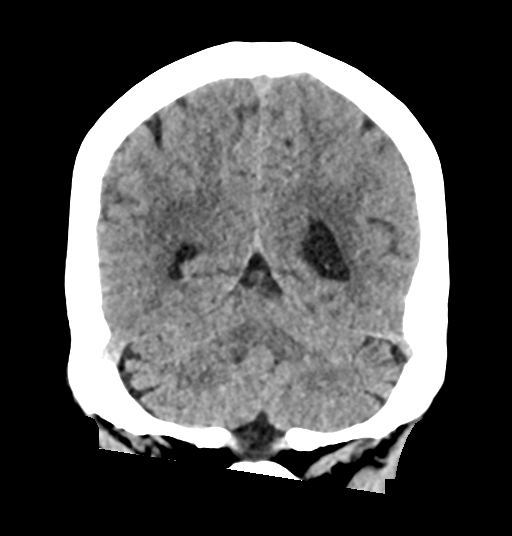
[im 31/70  brain]
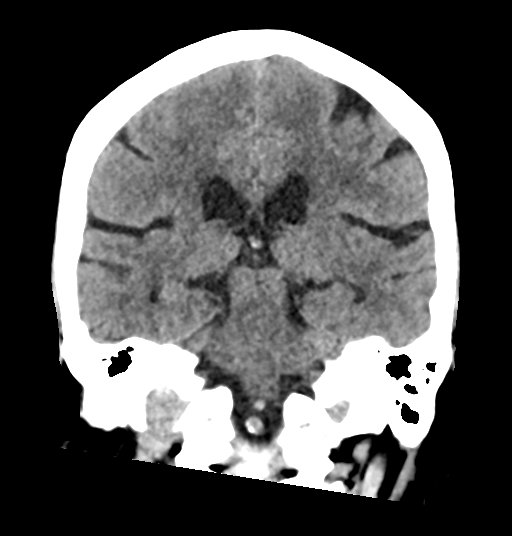
[im 39/70  brain]
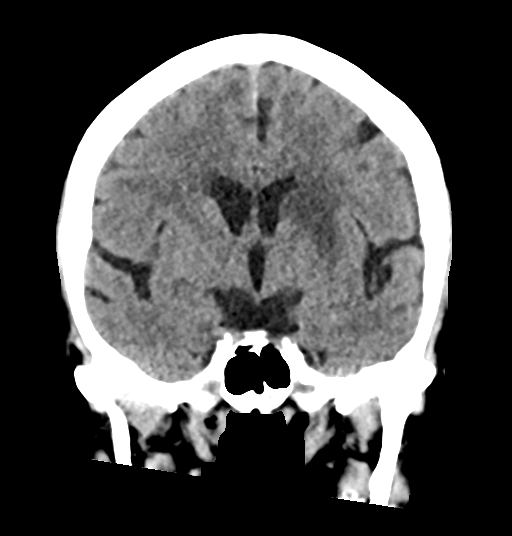

[Series 6: sag soft · sagittal · 0.31mm/px · 3 of 52 slices shown]
[im 17/52  brain]
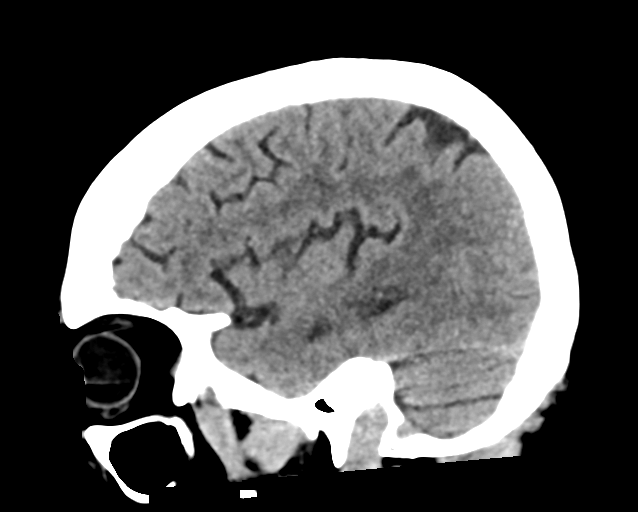
[im 24/52  brain]
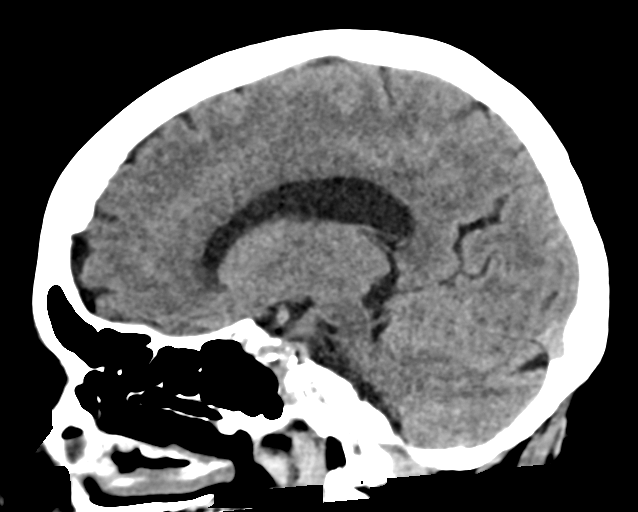
[im 31/52  brain]
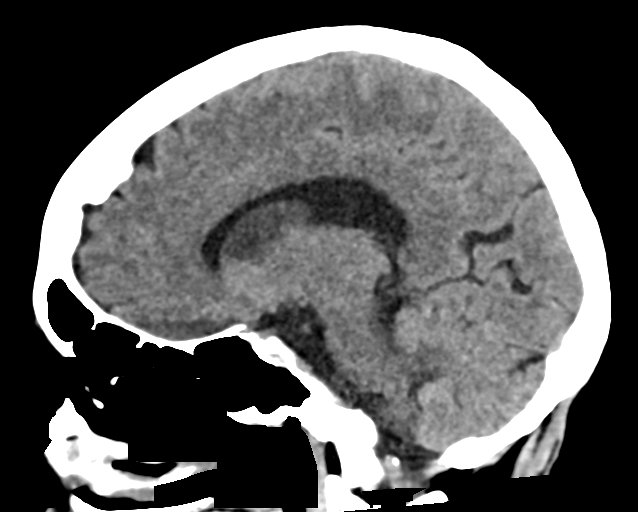

[16 of 47 positions shown; findings below may reference images not displayed]

FINDINGS: Brain: Evolving acute left basal ganglia infarct with increased
edema. Mild effacement of the left frontal horn lateral ventricle
without midline shift. No acute hemorrhage. No hydrocephalus, mass
lesion, or extra-axial fluid collection.

Vascular: No hyperdense vessel identified.

Skull: No acute fracture.

Sinuses/Orbits: Clear sinuses.  No acute orbital findings.

Other: No mastoid effusions.
IMPRESSION: Evolving acute left basal ganglia infarct with increased edema. Mild
effacement of the frontal horn of the left lateral ventricle without
midline shift. No acute hemorrhage.

## 2023-04-17 ENCOUNTER — Encounter (HOSPITAL_BASED_OUTPATIENT_CLINIC_OR_DEPARTMENT_OTHER): Payer: Self-pay | Admitting: Cardiology

## 2023-04-20 DIAGNOSIS — Z8673 Personal history of transient ischemic attack (TIA), and cerebral infarction without residual deficits: Secondary | ICD-10-CM | POA: Diagnosis not present

## 2023-04-20 DIAGNOSIS — E782 Mixed hyperlipidemia: Secondary | ICD-10-CM | POA: Diagnosis not present

## 2023-04-20 DIAGNOSIS — N1831 Chronic kidney disease, stage 3a: Secondary | ICD-10-CM | POA: Diagnosis not present

## 2023-04-20 DIAGNOSIS — I10 Essential (primary) hypertension: Secondary | ICD-10-CM | POA: Diagnosis not present

## 2023-05-19 ENCOUNTER — Other Ambulatory Visit (HOSPITAL_BASED_OUTPATIENT_CLINIC_OR_DEPARTMENT_OTHER): Payer: Self-pay | Admitting: Cardiology

## 2023-05-19 DIAGNOSIS — I10 Essential (primary) hypertension: Secondary | ICD-10-CM

## 2023-07-27 DIAGNOSIS — E782 Mixed hyperlipidemia: Secondary | ICD-10-CM | POA: Diagnosis not present

## 2023-07-27 DIAGNOSIS — Z8673 Personal history of transient ischemic attack (TIA), and cerebral infarction without residual deficits: Secondary | ICD-10-CM | POA: Diagnosis not present

## 2023-07-27 DIAGNOSIS — I10 Essential (primary) hypertension: Secondary | ICD-10-CM | POA: Diagnosis not present

## 2023-07-27 DIAGNOSIS — N1831 Chronic kidney disease, stage 3a: Secondary | ICD-10-CM | POA: Diagnosis not present

## 2023-07-31 ENCOUNTER — Telehealth: Payer: Self-pay | Admitting: Cardiology

## 2023-07-31 NOTE — Telephone Encounter (Signed)
PCP had told her to not to take the Chlorthalidone, she did not stop and does not know she is taking  PCP concerned about kidney's Wants to start her on Olmesartan 40 mg daily, stop ASA, and start Fish oil  Would like Dr Di Kindle opinion before making these changes   Labs from October printed  Will forward to Dr Cristal Deer for review

## 2023-07-31 NOTE — Telephone Encounter (Signed)
Pt c/o medication issue:  1. Name of Medication: chlorthalidone (HYGROTON) 25 MG tablet   2. How are you currently taking this medication (dosage and times per day)? 25 mg, Daily   3. Are you having a reaction (difficulty breathing--STAT)? No  4. What is your medication issue? Toma Deiters, MD is wanting pt to stop-chlorthalidone (HYGROTON) 25 MG tablet and start Olmesartan 40mg  every day. Stop aspirin 81 MG EC tablet and start fish oil

## 2023-09-13 NOTE — Telephone Encounter (Signed)
 Spoke with patient and updated medication list  Changes made to medication list Patient is satisfied with PCP changes Blood pressure has been running in the 160's prior to medications Advised to check blood pressure about 2 hours after medications for 2 weeks and either call office or PCP with updated readings Patient verbalized understanding  Per patient and daughter PCP d/c ASA   Will forward to Dr Cristal Deer so she will be aware

## 2023-09-13 NOTE — Telephone Encounter (Signed)
 Olmesartan does offer kidney protective benefit and is used frequently by our team. Recommend calling patient - if still with concerns with medication regimen would advise office visit with Dr. Cristal Deer or APP.   Alver Sorrow, NP

## 2023-10-11 DIAGNOSIS — R040 Epistaxis: Secondary | ICD-10-CM | POA: Diagnosis not present

## 2023-10-11 DIAGNOSIS — I10 Essential (primary) hypertension: Secondary | ICD-10-CM | POA: Diagnosis not present

## 2023-11-08 DIAGNOSIS — I10 Essential (primary) hypertension: Secondary | ICD-10-CM | POA: Diagnosis not present

## 2023-11-08 DIAGNOSIS — R040 Epistaxis: Secondary | ICD-10-CM | POA: Diagnosis not present

## 2023-11-08 DIAGNOSIS — Z1331 Encounter for screening for depression: Secondary | ICD-10-CM | POA: Diagnosis not present

## 2023-11-08 DIAGNOSIS — Z0000 Encounter for general adult medical examination without abnormal findings: Secondary | ICD-10-CM | POA: Diagnosis not present

## 2023-11-08 DIAGNOSIS — N1831 Chronic kidney disease, stage 3a: Secondary | ICD-10-CM | POA: Diagnosis not present

## 2023-11-08 DIAGNOSIS — E782 Mixed hyperlipidemia: Secondary | ICD-10-CM | POA: Diagnosis not present

## 2023-11-08 DIAGNOSIS — Z8673 Personal history of transient ischemic attack (TIA), and cerebral infarction without residual deficits: Secondary | ICD-10-CM | POA: Diagnosis not present

## 2024-01-10 DIAGNOSIS — Z780 Asymptomatic menopausal state: Secondary | ICD-10-CM | POA: Diagnosis not present

## 2024-01-10 DIAGNOSIS — M8588 Other specified disorders of bone density and structure, other site: Secondary | ICD-10-CM | POA: Diagnosis not present

## 2024-01-10 DIAGNOSIS — Z13820 Encounter for screening for osteoporosis: Secondary | ICD-10-CM | POA: Diagnosis not present

## 2024-01-10 DIAGNOSIS — M810 Age-related osteoporosis without current pathological fracture: Secondary | ICD-10-CM | POA: Diagnosis not present

## 2024-02-15 DIAGNOSIS — E782 Mixed hyperlipidemia: Secondary | ICD-10-CM | POA: Diagnosis not present

## 2024-02-15 DIAGNOSIS — N1831 Chronic kidney disease, stage 3a: Secondary | ICD-10-CM | POA: Diagnosis not present

## 2024-02-15 DIAGNOSIS — I10 Essential (primary) hypertension: Secondary | ICD-10-CM | POA: Diagnosis not present

## 2024-02-15 DIAGNOSIS — Z8673 Personal history of transient ischemic attack (TIA), and cerebral infarction without residual deficits: Secondary | ICD-10-CM | POA: Diagnosis not present

## 2024-03-25 DIAGNOSIS — N1831 Chronic kidney disease, stage 3a: Secondary | ICD-10-CM | POA: Diagnosis not present

## 2024-03-25 DIAGNOSIS — I10 Essential (primary) hypertension: Secondary | ICD-10-CM | POA: Diagnosis not present

## 2024-04-17 DIAGNOSIS — Z23 Encounter for immunization: Secondary | ICD-10-CM | POA: Diagnosis not present

## 2024-04-25 DIAGNOSIS — Z23 Encounter for immunization: Secondary | ICD-10-CM | POA: Diagnosis not present

## 2024-05-16 DIAGNOSIS — Z8673 Personal history of transient ischemic attack (TIA), and cerebral infarction without residual deficits: Secondary | ICD-10-CM | POA: Diagnosis not present

## 2024-05-16 DIAGNOSIS — E782 Mixed hyperlipidemia: Secondary | ICD-10-CM | POA: Diagnosis not present

## 2024-05-16 DIAGNOSIS — I10 Essential (primary) hypertension: Secondary | ICD-10-CM | POA: Diagnosis not present

## 2024-05-16 DIAGNOSIS — N1831 Chronic kidney disease, stage 3a: Secondary | ICD-10-CM | POA: Diagnosis not present
